# Patient Record
Sex: Female | Born: 2000 | Race: White | Hispanic: No | Marital: Single | Smoking: Current every day smoker
Health system: Southern US, Community
[De-identification: ages and names within clinical notes are randomized; demographics above are authoritative.]

## PROBLEM LIST (undated history)

## (undated) DIAGNOSIS — Z87448 Personal history of other diseases of urinary system: Secondary | ICD-10-CM

---
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
## 2021-02-09 ENCOUNTER — Encounter: Payer: Self-pay | Admitting: Emergency Medicine

## 2021-02-09 ENCOUNTER — Ambulatory Visit
Admission: EM | Admit: 2021-02-09 | Discharge: 2021-02-09 | Disposition: A | Discharge status: Home / Self Care | Payer: Medicaid Other | Attending: Emergency Medicine | Admitting: Emergency Medicine

## 2021-02-09 ENCOUNTER — Other Ambulatory Visit: Payer: Self-pay

## 2021-02-09 DIAGNOSIS — R350 Frequency of micturition: Secondary | ICD-10-CM

## 2021-02-09 LAB — POCT URINALYSIS DIP (MANUAL ENTRY)
Bilirubin, UA: NEGATIVE
Blood, UA: NEGATIVE
Glucose, UA: NEGATIVE mg/dL
Ketones, POC UA: NEGATIVE mg/dL
Nitrite, UA: NEGATIVE
Protein Ur, POC: NEGATIVE mg/dL
Spec Grav, UA: 1.025 (ref 1.010–1.025)
Urobilinogen, UA: 0.2 E.U./dL
pH, UA: 7 (ref 5.0–8.0)

## 2021-02-09 LAB — POCT URINE PREGNANCY: Preg Test, Ur: NEGATIVE

## 2021-02-09 MED ORDER — SULFAMETHOXAZOLE-TRIMETHOPRIM 800-160 MG PO TABS: 1.0000 | ORAL_TABLET | Freq: Two times a day (BID) | ORAL | 0 refills | Status: AC

## 2021-02-09 NOTE — ED Triage Notes (Signed)
Patient c/o increased urinary frequency x 1 month.   Patient endorses dribbling. Patient endorses intermittent ABD pain.   Patient endorses frequent episodes of UTI over the past 6 months. Patient's last episode was 3-4 months ago.   Patient denies dysuria, foul odor, hematuria, and abnormal vaginal discharge.   Patient hasn't used any medications for symptoms.

## 2021-02-09 NOTE — Discharge Instructions (Signed)
Urine culture pending, vaginal swab pending Begin Bactrim twice daily for 1 week Follow-up with primary care/urology for further evaluation of persistent urinary symptoms

## 2021-02-09 NOTE — ED Provider Notes (Signed)
[facility]    CSN: [id number] Arrival date & time: 02/09/21  1707      History   Chief Complaint Chief Complaint  Patient presents with  . Urinary Frequency    HPI Ma[facility] is a 20 y.o. female presenting today for evaluation of possible UTI.  Reports urinary frequency x1 month.  Reports continued pressure in urination after emptying bladder.  Denies dysuria.  Reports history of prior UTIs.  History of vesicoureteral reflux when younger and saw urology, has not seen urology recently.  She denies any fever, nausea vomiting or abdominal pain.  Denies any vaginal symptoms of discharge itching or irritation.  HPI  Past Medical History:  Diagnosis Date  . History of urinary reflux     There are no problems to display for this patient.   History reviewed. No pertinent surgical history.  OB History   No obstetric history on file.      Home Medications    Prior to Admission medications   Medication Sig Start Date End Date Taking? Authorizing Provider  sulfamethoxazole-trimethoprim (BACTRIM DS) 800-160 MG tablet Take 1 tablet by mouth 2 (two) times daily for 7 days. 02/09/21 02/16/21 Yes Caylan Schifano C, PA-C  etonogestrel (NEXPLANON) 68 MG IMPL implant 68 mg.    [provider]  lansoprazole (PREVACID) 15 MG capsule Take 15 mg by mouth daily at 12 noon.  02/09/21  [provider]  norelgestromin-ethinyl estradiol (ORTHO EVRA) 150-35 MCG/24HR transdermal patch Place onto the skin. 11/23/15 02/09/21  [provider]    Family History History reviewed. No pertinent family history.  Social History Social History   Tobacco Use  . Smoking status: Never Smoker  . Smokeless tobacco: Never Used  Vaping Use  . Vaping Use: Every day  Substance Use Topics  . Alcohol use: Never  . Drug use: Never     Allergies   Rocephin [ceftriaxone]   Review of Systems Review of Systems  Constitutional: Negative for fever.  Respiratory:  Negative for shortness of breath.   Cardiovascular: Negative for chest pain.  Gastrointestinal: Negative for abdominal pain, diarrhea, nausea and vomiting.  Genitourinary: Positive for frequency. Negative for dysuria, flank pain, genital sores, hematuria, menstrual problem, vaginal bleeding, vaginal discharge and vaginal pain.  Musculoskeletal: Negative for back pain.  Skin: Negative for rash.  Neurological: Negative for dizziness, light-headedness and headaches.     Physical Exam Triage Vital Signs ED Triage Vitals  Enc Vitals Group     BP      Pulse      Resp      Temp      Temp src      SpO2      Weight      Height      Head Circumference      Peak Flow      Pain Score      Pain Loc      Pain Edu?      Excl. in GC?    No data found.  Updated Vital Signs BP 106/69 (BP Location: Left Arm)   Pulse 91   Temp 99.2 F (37.3 C) (Oral)   Resp 15   Ht 5' 2.5" (1.588 m)   LMP 01/26/2021   SpO2 98%   Visual Acuity Right Eye Distance:   Left Eye Distance:   Bilateral Distance:    Right Eye Near:   Left Eye Near:    Bilateral Near:     Physical Exam  Vitals and nursing note reviewed.  Constitutional:      Appearance: She is well-developed.     Comments: No acute distress  HENT:     Head: Normocephalic and atraumatic.     Nose: Nose normal.  Eyes:     Conjunctiva/sclera: Conjunctivae normal.  Cardiovascular:     Rate and Rhythm: Normal rate.  Pulmonary:     Effort: Pulmonary effort is normal. No respiratory distress.  Abdominal:     General: There is no distension.  Musculoskeletal:        General: Normal range of motion.     Cervical back: Neck supple.  Skin:    General: Skin is warm and dry.  Neurological:     Mental Status: She is alert and oriented to person, place, and time.      UC Treatments / Results  Labs (all labs ordered are listed, but only abnormal results are displayed) Labs Reviewed  POCT URINALYSIS DIP (MANUAL ENTRY) - Abnormal;  Notable for the following components:      Result Value   Clarity, UA cloudy (*)    Leukocytes, UA Trace (*)    All other components within normal limits  URINE CULTURE  POCT URINE PREGNANCY  CERVICOVAGINAL ANCILLARY ONLY    EKG   Radiology No results found.  Procedures Procedures (including critical care time)  Medications Ordered in UC Medications - No data to display  Initial Impression / Assessment and Plan / UC Course  I have reviewed the triage vital signs and the nursing notes.  Pertinent labs & imaging results that were available during my care of the patient were reviewed by me and considered in my medical decision making (see chart for details).     UA with only trace leuks, will send for urine culture as well as checking vaginal swab.  Given patient's history did opt to still proceed with treatment for UTI with Bactrim, has prior allergy to Rocephin, will avoid Keflex for now.  Encouraged follow-up with PCP/urology if symptoms persisting and urine culture and vaginal swab negative/not improving with clinical treatment for UTI.  Discussed strict return precautions. Patient verbalized understanding and is agreeable with plan.  Final Clinical Impressions(s) / UC Diagnoses   Final diagnoses:  Urinary frequency     Discharge Instructions     Urine culture pending, vaginal swab pending Begin Bactrim twice daily for 1 week Follow-up with primary care/urology for further evaluation of persistent urinary symptoms    ED Prescriptions    Medication Sig Dispense Auth. Provider   sulfamethoxazole-trimethoprim (BACTRIM DS) 800-160 MG tablet Take 1 tablet by mouth 2 (two) times daily for 7 days. 14 tablet Teffany Blaszczyk, [place] C, PA-C     PDMP not reviewed this encounter.   Lew Dawes, [place] 02/09/21 1857

## 2021-02-11 LAB — URINE CULTURE: Culture: <10000 — AB

## 2021-02-12 LAB — CERVICOVAGINAL ANCILLARY ONLY
Bacterial Vaginitis (gardnerella): POSITIVE — AB
Candida Glabrata: NEGATIVE
Candida Vaginitis: NEGATIVE
Chlamydia: NEGATIVE
Comment: NEGATIVE
Comment: NEGATIVE
Comment: NEGATIVE
Comment: NEGATIVE
Comment: NEGATIVE
Comment: NORMAL
Neisseria Gonorrhea: NEGATIVE
Trichomonas: NEGATIVE

## 2021-02-13 ENCOUNTER — Telehealth (HOSPITAL_COMMUNITY): Payer: Self-pay | Admitting: Emergency Medicine

## 2021-02-13 MED ORDER — METRONIDAZOLE 500 MG PO TABS: 500.0000 mg | ORAL_TABLET | Freq: Two times a day (BID) | ORAL | 0 refills | Status: DC

## 2021-07-05 ENCOUNTER — Encounter (HOSPITAL_BASED_OUTPATIENT_CLINIC_OR_DEPARTMENT_OTHER): Payer: Self-pay | Admitting: Emergency Medicine

## 2021-07-05 ENCOUNTER — Other Ambulatory Visit: Payer: Self-pay

## 2021-07-05 ENCOUNTER — Emergency Department (HOSPITAL_BASED_OUTPATIENT_CLINIC_OR_DEPARTMENT_OTHER)
Admission: EM | Admit: 2021-07-05 | Discharge: 2021-07-05 | Disposition: A | Discharge status: Home / Self Care | Payer: Medicaid Other | Attending: Emergency Medicine | Admitting: Emergency Medicine

## 2021-07-05 DIAGNOSIS — B349 Viral infection, unspecified: Secondary | ICD-10-CM | POA: Diagnosis not present

## 2021-07-05 DIAGNOSIS — R590 Localized enlarged lymph nodes: Secondary | ICD-10-CM | POA: Insufficient documentation

## 2021-07-05 DIAGNOSIS — R519 Headache, unspecified: Secondary | ICD-10-CM | POA: Diagnosis present

## 2021-07-05 NOTE — ED Provider Notes (Signed)
[facility] EMERGENCY DEPARTMENT Provider Note   CSN: [id number] Arrival date & time: 07/05/21  0830     History Chief Complaint  Patient presents with   Sore Throat    Maureen Bailey is a 20 y.o. female presented emerged department with headache for 3 days and a sore throat for 2 days.  Patient reports that the headache was mild and frontal, she does not typically have headaches.  That is improved but she began having a sore throat.  She describes as discomfort in her lower neck when she swallows specifically, no pain with speaking.  No pain in the back of the mouth.  She was reported distant history of strep throat when she was younger.  She has had no fevers or chills, no nausea or vomiting.  She lives in a house with 2 children who are both actively sick with congestion, cough.  HPI     Past Medical History:  Diagnosis Date   History of urinary reflux     There are no problems to display for this patient.   History reviewed. No pertinent surgical history.   OB History   No obstetric history on file.     No family history on file.  Social History   Tobacco Use   Smoking status: Never   Smokeless tobacco: Never  Vaping Use   Vaping Use: Every day  Substance Use Topics   Alcohol use: Never   Drug use: Never    Home Medications Prior to Admission medications   Medication Sig Start Date End Date Taking? Authorizing Provider  etonogestrel (NEXPLANON) 68 MG IMPL implant 68 mg.    [provider]  metroNIDAZOLE (FLAGYL) 500 MG tablet Take 1 tablet (500 mg total) by mouth 2 (two) times daily. 02/13/21   Merrilee Jansky, MD  lansoprazole (PREVACID) 15 MG capsule Take 15 mg by mouth daily at 12 noon.  02/09/21  [provider]  norelgestromin-ethinyl estradiol (ORTHO EVRA) 150-35 MCG/24HR transdermal patch Place onto the skin. 11/23/15 02/09/21  [provider]    Allergies    Ceftriaxone  Review of Systems   Review of Systems   Constitutional:  Negative for chills and fever.  HENT:  Positive for sore throat. Negative for ear pain, rhinorrhea, trouble swallowing and voice change.   Eyes:  Negative for pain and visual disturbance.  Respiratory:  Negative for cough and shortness of breath.   Cardiovascular:  Negative for chest pain and palpitations.  Gastrointestinal:  Negative for abdominal pain and vomiting.  Genitourinary:  Negative for dysuria and hematuria.  Musculoskeletal:  Negative for arthralgias and back pain.  Skin:  Negative for color change and rash.  Neurological:  Positive for headaches. Negative for syncope and light-headedness.  All other systems reviewed and are negative.  Physical Exam Updated Vital Signs BP 114/63 (BP Location: Left Arm)   Pulse (!) 59   Temp 98.5 F (36.9 C) (Oral)   Resp 20   Ht 5\' 2"  (1.575 m)   Wt 43 kg   LMP 06/01/2021 Comment: period lasted one month  SpO2 100%   BMI 17.32 kg/m   Physical Exam Constitutional:      General: She is not in acute distress. HENT:     Head: Normocephalic and atraumatic.     Mouth/Throat:     Mouth: Mucous membranes are moist. No oral lesions.     Pharynx: No pharyngeal swelling, oropharyngeal exudate, posterior oropharyngeal erythema or uvula swelling.  Comments: +Cervical lympnadenopathy Eyes:     Conjunctiva/sclera: Conjunctivae normal.     Pupils: Pupils are equal, round, and reactive to light.  Cardiovascular:     Rate and Rhythm: Normal rate and regular rhythm.  Pulmonary:     Effort: Pulmonary effort is normal. No respiratory distress.  Abdominal:     General: There is no distension.     Tenderness: There is no abdominal tenderness.  Skin:    General: Skin is warm and dry.  Neurological:     General: No focal deficit present.     Mental Status: She is alert. Mental status is at baseline.  Psychiatric:        Mood and Affect: Mood normal.        Behavior: Behavior normal.    ED Results / Procedures /  Treatments   Labs (all labs ordered are listed, but only abnormal results are displayed) Labs Reviewed - No data to display  EKG None  Radiology No results found.  Procedures Procedures   Medications Ordered in ED Medications - No data to display  ED Course  I have reviewed the triage vital signs and the nursing notes.  Pertinent labs & imaging results that were available during my care of the patient were reviewed by me and considered in my medical decision making (see chart for details).  I strongly suspect this is a viral syndrome, given that she has had a headache, has a cervical lymphadenopathy, and has multiple sick contacts in the same house.  I do not see any evidence otherwise of strep pharyngitis on her exam, no tonsillar exudates or swelling.  She does not want a COVID test.  Her vital signs are otherwise stable.  I doubt sepsis or bacterial infection at this time.  We discussed supportive care at home for the next several days, advise follow-up with a PCP or urgent care if she continues having sore throat beyond that.  She verbalized understanding.  Final Clinical Impression(s) / ED Diagnoses Final diagnoses:  Cervical lymphadenopathy  Viral syndrome    Rx / DC Orders ED Discharge Orders     None        Terald Sleeper, MD 07/05/21 1156

## 2021-07-05 NOTE — Discharge Instructions (Addendum)
I suspect you may have a viral syndrome.  I recommend he continue taking ibuprofen and drink plenty of water for the next several days.  Most viruses resolve within a week.  You have some tenderness of your lymph nodes which is a normal reaction to a virus.  This may be causing your sore throat.  If your symptoms get worse, I recommend visiting an urgent care or primary care physician for your sore throat.  If you begin having chest pain, lightheadedness, dizziness, loss of consciousness, you should come back to the emergency room.

## 2021-07-05 NOTE — ED Triage Notes (Signed)
Sore throat x 2 days.  No fever.

## 2021-09-04 ENCOUNTER — Other Ambulatory Visit: Payer: Self-pay

## 2021-09-04 ENCOUNTER — Ambulatory Visit
Admission: EM | Admit: 2021-09-04 | Discharge: 2021-09-04 | Disposition: A | Discharge status: Home / Self Care | Payer: Medicaid Other | Attending: Physician Assistant | Admitting: Physician Assistant

## 2021-09-04 DIAGNOSIS — J101 Influenza due to other identified influenza virus with other respiratory manifestations: Secondary | ICD-10-CM | POA: Diagnosis not present

## 2021-09-04 LAB — POCT INFLUENZA A/B
Influenza A, POC: POSITIVE — AB
Influenza B, POC: NEGATIVE

## 2021-09-04 MED ORDER — OSELTAMIVIR PHOSPHATE 75 MG PO CAPS: 75.0000 mg | ORAL_CAPSULE | Freq: Two times a day (BID) | ORAL | 0 refills | Status: DC

## 2021-09-04 NOTE — ED Provider Notes (Signed)
[facility]    CSN: [id number] Arrival date & time: 09/04/21  0914      History   Chief Complaint Chief Complaint  Patient presents with   Cough    HPI Maureen Bailey is a 20 y.o. female.   Patient here today for evaluation of cough congestion she has had for the last week.  She is also had some fever.  She denies any vomiting or diarrhea.  She has tried over-the-counter medication without significant relief.  The history is provided by the patient.  Cough Associated symptoms: fever and sore throat   Associated symptoms: no ear pain, no eye discharge, no shortness of breath and no wheezing    Past Medical History:  Diagnosis Date   History of urinary reflux     There are no problems to display for this patient.   History reviewed. No pertinent surgical history.  OB History   No obstetric history on file.      Home Medications    Prior to Admission medications   Medication Sig Start Date End Date Taking? Authorizing Provider  oseltamivir (TAMIFLU) 75 MG capsule Take 1 capsule (75 mg total) by mouth every 12 (twelve) hours. 09/04/21  Yes Tomi Bamberger, PA-C  etonogestrel (NEXPLANON) 68 MG IMPL implant 68 mg.    [provider]  lansoprazole (PREVACID) 15 MG capsule Take 15 mg by mouth daily at 12 noon.  02/09/21  [provider]  norelgestromin-ethinyl estradiol (ORTHO EVRA) 150-35 MCG/24HR transdermal patch Place onto the skin. 11/23/15 02/09/21  [provider]    Family History History reviewed. No pertinent family history.  Social History Social History   Tobacco Use   Smoking status: Never   Smokeless tobacco: Never  Vaping Use   Vaping Use: Every day  Substance Use Topics   Alcohol use: Never   Drug use: Never     Allergies   Ceftriaxone   Review of Systems Review of Systems  Constitutional:  Positive for fatigue and fever.  HENT:  Positive for congestion, sinus pressure and sore throat. Negative  for ear pain.   Eyes:  Negative for discharge and redness.  Respiratory:  Positive for cough. Negative for shortness of breath and wheezing.   Gastrointestinal:  Negative for abdominal pain, diarrhea, nausea and vomiting.    Physical Exam Triage Vital Signs ED Triage Vitals  Enc Vitals Group     BP 09/04/21 1035 120/80     Pulse Rate 09/04/21 1035 (!) 104     Resp 09/04/21 1035 18     Temp 09/04/21 1035 99.5 F (37.5 C)     Temp Source 09/04/21 1035 Oral     SpO2 09/04/21 1035 99 %     Weight --      Height --      Head Circumference --      Peak Flow --      Pain Score 09/04/21 1036 8     Pain Loc --      Pain Edu? --      Excl. in [place]? --    No data found.  Updated Vital Signs BP 120/80 (BP Location: Left Arm)   Pulse (!) 104   Temp 99.5 F (37.5 C) (Oral)   Resp 18   LMP 09/03/2021   SpO2 99%      Physical Exam Vitals and nursing note reviewed.  Constitutional:      General: She is not in acute distress.  Appearance: Normal appearance. She is not ill-appearing.  HENT:     Head: Normocephalic and atraumatic.     Right Ear: Tympanic membrane normal.     Left Ear: Tympanic membrane normal.     Nose: Congestion present.     Mouth/Throat:     Mouth: Mucous membranes are moist.     Pharynx: No oropharyngeal exudate or posterior oropharyngeal erythema.  Eyes:     Conjunctiva/sclera: Conjunctivae normal.  Cardiovascular:     Rate and Rhythm: Normal rate and regular rhythm.     Heart sounds: Normal heart sounds. No murmur heard. Pulmonary:     Effort: Pulmonary effort is normal. No respiratory distress.     Breath sounds: Normal breath sounds. No wheezing, rhonchi or rales.  Skin:    General: Skin is warm and dry.  Neurological:     Mental Status: She is alert.  Psychiatric:        Mood and Affect: Mood normal.        Thought Content: Thought content normal.     UC Treatments / Results  Labs (all labs ordered are listed, but only abnormal results are  displayed) Labs Reviewed  POCT INFLUENZA A/B - Abnormal; Notable for the following components:      Result Value   Influenza A, POC Positive (*)    All other components within normal limits    EKG   Radiology No results found.  Procedures Procedures (including critical care time)  Medications Ordered in UC Medications - No data to display  Initial Impression / Assessment and Plan / UC Course  I have reviewed the triage vital signs and the nursing notes.  Pertinent labs & imaging results that were available during my care of the patient were reviewed by me and considered in my medical decision making (see chart for details).  Flu test positive in office.  Will treat with Tamiflu given continued symptoms.  Recommended symptomatic treatment otherwise.  Encouraged follow-up if symptoms fail to improve or worsen.  Final Clinical Impressions(s) / UC Diagnoses   Final diagnoses:  Influenza A   Discharge Instructions   None    ED Prescriptions     Medication Sig Dispense Auth. Provider   oseltamivir (TAMIFLU) 75 MG capsule Take 1 capsule (75 mg total) by mouth every 12 (twelve) hours. 10 capsule Tomi Bamberger, PA-C      PDMP not reviewed this encounter.   Tomi Bamberger, PA-C 09/04/21 1124

## 2021-09-04 NOTE — ED Triage Notes (Signed)
Pt c/o cough, congestion, fatigue, fever, and bilateral ear pain x1wk. Taking OTC meds with mild relief.

## 2022-07-09 ENCOUNTER — Emergency Department (HOSPITAL_BASED_OUTPATIENT_CLINIC_OR_DEPARTMENT_OTHER)
Admission: EM | Admit: 2022-07-09 | Discharge: 2022-07-09 | Discharge status: Against Medical Advice | Payer: Medicaid Other | Attending: Emergency Medicine | Admitting: Emergency Medicine

## 2022-07-09 ENCOUNTER — Other Ambulatory Visit: Payer: Self-pay

## 2022-07-09 ENCOUNTER — Encounter: Payer: Self-pay | Admitting: Physician Assistant

## 2022-07-09 ENCOUNTER — Ambulatory Visit
Admission: EM | Admit: 2022-07-09 | Discharge: 2022-07-09 | Disposition: A | Discharge status: Home / Self Care | Payer: Medicaid Other | Attending: Physician Assistant | Admitting: Physician Assistant

## 2022-07-09 DIAGNOSIS — R809 Proteinuria, unspecified: Secondary | ICD-10-CM

## 2022-07-09 DIAGNOSIS — R824 Acetonuria: Secondary | ICD-10-CM | POA: Diagnosis not present

## 2022-07-09 DIAGNOSIS — R109 Unspecified abdominal pain: Secondary | ICD-10-CM | POA: Diagnosis not present

## 2022-07-09 DIAGNOSIS — Z5321 Procedure and treatment not carried out due to patient leaving prior to being seen by health care provider: Secondary | ICD-10-CM | POA: Diagnosis not present

## 2022-07-09 DIAGNOSIS — Z20822 Contact with and (suspected) exposure to covid-19: Secondary | ICD-10-CM | POA: Insufficient documentation

## 2022-07-09 DIAGNOSIS — R1110 Vomiting, unspecified: Secondary | ICD-10-CM | POA: Insufficient documentation

## 2022-07-09 DIAGNOSIS — R112 Nausea with vomiting, unspecified: Secondary | ICD-10-CM | POA: Diagnosis not present

## 2022-07-09 LAB — CBC WITH DIFFERENTIAL/PLATELET
Abs Immature Granulocytes: 0.09 10*3/uL — ABNORMAL HIGH (ref 0.00–0.07)
Basophils Absolute: 0.1 10*3/uL (ref 0.0–0.1)
Basophils Relative: 0 %
Eosinophils Absolute: 0 10*3/uL (ref 0.0–0.5)
Eosinophils Relative: 0 %
HCT: 42.9 % (ref 36.0–46.0)
Hemoglobin: 14.1 g/dL (ref 12.0–15.0)
Immature Granulocytes: 1 %
Lymphocytes Relative: 4 %
Lymphs Abs: 0.7 10*3/uL (ref 0.7–4.0)
MCH: 27.7 pg (ref 26.0–34.0)
MCHC: 32.9 g/dL (ref 30.0–36.0)
MCV: 84.3 fL (ref 80.0–100.0)
Monocytes Absolute: 0.5 10*3/uL (ref 0.1–1.0)
Monocytes Relative: 3 %
Neutro Abs: 16.8 10*3/uL — ABNORMAL HIGH (ref 1.7–7.7)
Neutrophils Relative %: 92 %
Platelets: 189 10*3/uL (ref 150–400)
RBC: 5.09 MIL/uL (ref 3.87–5.11)
RDW: 12.5 % (ref 11.5–15.5)
Smear Review: NORMAL
WBC: 18 10*3/uL — ABNORMAL HIGH (ref 4.0–10.5)
nRBC: 0 % (ref 0.0–0.2)

## 2022-07-09 LAB — COMPREHENSIVE METABOLIC PANEL
ALT: 15 U/L (ref 0–44)
AST: 20 U/L (ref 15–41)
Albumin: 4.6 g/dL (ref 3.5–5.0)
Alkaline Phosphatase: 55 U/L (ref 38–126)
Anion gap: 11 (ref 5–15)
BUN: 14 mg/dL (ref 6–20)
CO2: 20 mmol/L — ABNORMAL LOW (ref 22–32)
Calcium: 9.5 mg/dL (ref 8.9–10.3)
Chloride: 107 mmol/L (ref 98–111)
Creatinine, Ser: 0.67 mg/dL (ref 0.44–1.00)
GFR, Estimated: >60 mL/min (ref >60)
Glucose, Bld: 118 mg/dL — ABNORMAL HIGH (ref 70–99)
Potassium: 3.5 mmol/L (ref 3.5–5.1)
Sodium: 138 mmol/L (ref 135–145)
Total Bilirubin: 1 mg/dL (ref 0.3–1.2)
Total Protein: 7.9 g/dL (ref 6.5–8.1)

## 2022-07-09 LAB — POCT URINALYSIS DIP (MANUAL ENTRY)
Glucose, UA: NEGATIVE mg/dL
Leukocytes, UA: NEGATIVE
Nitrite, UA: NEGATIVE
Protein Ur, POC: 100 mg/dL — AB
Spec Grav, UA: 1.025 (ref 1.010–1.025)
Urobilinogen, UA: 0.2 E.U./dL
pH, UA: 7 (ref 5.0–8.0)

## 2022-07-09 LAB — LIPASE, BLOOD: Lipase: 30 U/L (ref 11–51)

## 2022-07-09 LAB — SARS CORONAVIRUS 2 BY RT PCR: SARS Coronavirus 2 by RT PCR: NEGATIVE

## 2022-07-09 LAB — POCT URINE PREGNANCY: Preg Test, Ur: NEGATIVE

## 2022-07-09 LAB — PREGNANCY, URINE: Preg Test, Ur: NEGATIVE

## 2022-07-09 NOTE — ED Triage Notes (Addendum)
Pt c/o abd pain and vomiting since this morning; sent here from UC for further w/u; UA results in chart; had zofran odt at 1330

## 2022-07-09 NOTE — ED Notes (Signed)
Patient is being discharged from the Urgent Care and sent to the Emergency Department via pov with mother . Per myers pa, patient is in need of higher level of care due to symptoms and dehydration. Patient is aware and verbalizes understanding of plan of care.  Vitals:   07/09/22 1555  BP: 100/64  Pulse: 78  Resp: 18  Temp: 97.6 F (36.4 C)  SpO2: 99%

## 2022-07-09 NOTE — ED Provider Notes (Signed)
[facility]    CSN: [id number] Arrival date & time: 07/09/22  1453      History   Chief Complaint Chief Complaint  Patient presents with   Abdominal Pain    HPI Maureen Bailey is a 21 y.o. female.   Patient here today for evaluation of nausea and vomiting that started today. She has had diarrhea for 2 days. She tried taking zofran at home with no improvement. She has also tried using bentyl without improvement of pain in her abdomen. She reports abdominal pain is a stabbing pain that is constant.   The history is provided by the patient.  Abdominal Pain Associated symptoms: diarrhea, fatigue, nausea and vomiting   Associated symptoms: no chills and no fever     Past Medical History:  Diagnosis Date   History of urinary reflux     There are no problems to display for this patient.   History reviewed. No pertinent surgical history.  OB History   No obstetric history on file.      Home Medications    Prior to Admission medications   Medication Sig Start Date End Date Taking? Authorizing Provider  etonogestrel (NEXPLANON) 68 MG IMPL implant 68 mg.    [provider]  oseltamivir (TAMIFLU) 75 MG capsule Take 1 capsule (75 mg total) by mouth every 12 (twelve) hours. 09/04/21   Francene Finders, PA-C  lansoprazole (PREVACID) 15 MG capsule Take 15 mg by mouth daily at 12 noon.  02/09/21  [provider]  norelgestromin-ethinyl estradiol (ORTHO EVRA) 150-35 MCG/24HR transdermal patch Place onto the skin. 11/23/15 02/09/21  [provider]    Family History History reviewed. No pertinent family history.  Social History Social History   Tobacco Use   Smoking status: Never   Smokeless tobacco: Never  Vaping Use   Vaping Use: Every day  Substance Use Topics   Alcohol use: Never   Drug use: Never     Allergies   Ceftriaxone   Review of Systems Review of Systems  Constitutional:  Positive for diaphoresis and fatigue.  Negative for chills and fever.  Eyes:  Negative for discharge and redness.  Gastrointestinal:  Positive for abdominal pain, diarrhea, nausea and vomiting.     Physical Exam Triage Vital Signs ED Triage Vitals  Enc Vitals Group     BP 07/09/22 1555 100/64     Pulse Rate 07/09/22 1555 78     Resp 07/09/22 1555 18     Temp 07/09/22 1555 97.6 F (36.4 C)     Temp Source 07/09/22 1555 Oral     SpO2 07/09/22 1555 99 %     Weight --      Height --      Head Circumference --      Peak Flow --      Pain Score 07/09/22 1554 4     Pain Loc --      Pain Edu? --      Excl. in [place]? --    No data found.  Updated Vital Signs BP 100/64   Pulse 78   Temp 97.6 F (36.4 C) (Oral)   Resp 18   LMP 07/01/2022   SpO2 99%       Physical Exam Vitals and nursing note reviewed.  Constitutional:      General: She is not in acute distress.    Appearance: Normal appearance. She is ill-appearing (appears to not feel well- sleeping curled up on exam table).  HENT:     Head: Normocephalic and atraumatic.  Eyes:     Conjunctiva/sclera: Conjunctivae normal.  Cardiovascular:     Rate and Rhythm: Normal rate.  Pulmonary:     Effort: Pulmonary effort is normal.  Neurological:     Mental Status: She is alert.  Psychiatric:        Mood and Affect: Mood normal.        Behavior: Behavior normal.        Thought Content: Thought content normal.      UC Treatments / Results  Labs (all labs ordered are listed, but only abnormal results are displayed) Labs Reviewed  POCT URINALYSIS DIP (MANUAL ENTRY) - Abnormal; Notable for the following components:      Result Value   Clarity, UA hazy (*)    Bilirubin, UA small (*)    Ketones, POC UA >= (160) (*)    Blood, UA trace-intact (*)    Protein Ur, POC =100 (*)    All other components within normal limits  POCT URINE PREGNANCY    EKG   Radiology No results found.  Procedures Procedures (including critical care time)  Medications Ordered  in UC Medications - No data to display  Initial Impression / Assessment and Plan / UC Course  I have reviewed the triage vital signs and the nursing notes.  Pertinent labs & imaging results that were available during my care of the patient were reviewed by me and considered in my medical decision making (see chart for details).    Given proteinuria recommended further evaluation in the ED for stat labs and fluid resuscitation/ imaging if needed. Patient is agreeable and grandmother who is here with her today will drive her via POV.   Final Clinical Impressions(s) / UC Diagnoses   Final diagnoses:  Nausea and vomiting, unspecified vomiting type  Proteinuria, unspecified type  Ketonuria   Discharge Instructions   None    ED Prescriptions   None    PDMP not reviewed this encounter.   Francene Finders, PA-C 07/09/22 1731

## 2022-07-09 NOTE — ED Triage Notes (Signed)
Pt presents to uc with co of abd pain and nausea, vomiting since this morning. Pt reports diarrhea for 2 days. Pt attempted Zofran at home with no improvement. Mother gave patient bentyl as well which has not helped with the pain. Pt st pain is 4/10 that is constant stabbing pain that is starting in lower abdomin and shooting up sides of abdomin

## 2023-05-07 ENCOUNTER — Ambulatory Visit
Admission: EM | Admit: 2023-05-07 | Discharge: 2023-05-07 | Disposition: A | Discharge status: Home / Self Care | Payer: Medicaid Other

## 2023-05-07 ENCOUNTER — Telehealth: Payer: Self-pay | Admitting: *Deleted

## 2023-05-07 DIAGNOSIS — B354 Tinea corporis: Secondary | ICD-10-CM | POA: Diagnosis not present

## 2023-05-07 MED ORDER — KETOCONAZOLE 2 % EX CREA: 1.0000 | TOPICAL_CREAM | Freq: Every day | CUTANEOUS | 0 refills | Status: DC

## 2023-05-07 NOTE — ED Triage Notes (Signed)
"  I have a spot that looks like ringworm on my left shoulder, I have used antifungal cream that hasn't helped". "I may have a few in other places too". No fever.

## 2023-05-07 NOTE — ED Provider Notes (Signed)
[facility]    CSN: [id number] Arrival date & time: 05/07/23  1452      History   Chief Complaint Chief Complaint  Patient presents with   Skin Problem    HPI Maureen Bailey is a 21 y.o. female.   Patient here today for possible ringworm to her left shoulder.  She states that she has had area for several days that seems to not be improving with antifungal cream she has bought over-the-counter.  She notes area is somewhat itchy.  She has some similar spots appearing that look like this area did initially.  She has not had any fever or shortness of breath.  The history is provided by the patient.    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 No pertinent surgical history.  OB History   No obstetric history on file.      Home Medications    Prior to Admission medications   Medication Sig Start Date End Date Taking? Authorizing Provider  acetaminophen (TYLENOL) 500 MG tablet Take 500 mg by mouth every 6 (six) hours as needed for mild pain, fever, headache or moderate pain. 01/05/20  Yes [provider]  etonogestrel (NEXPLANON) 68 MG IMPL implant 68 mg.   Yes [provider]  ketoconazole (NIZORAL) 2 % cream Apply 1 Application topically daily. 05/07/23  Yes Tomi Bamberger, PA-C  lansoprazole (PREVACID) 15 MG capsule Take 15 mg by mouth as directed. 01/05/20  Yes [provider]  lidocaine (LIDODERM) 5 % Place 1 patch onto the skin daily. 10/25/22  Yes [provider]  norelgestromin-ethinyl estradiol [employer]) 150-35 MCG/24HR transdermal patch Place 1 patch onto the skin once a week. 11/23/15  Yes [provider]  ondansetron (ZOFRAN-ODT) 4 MG disintegrating  tablet Take 4 mg by mouth every 8 (eight) hours as needed for nausea or vomiting. 07/10/22  Yes [provider]  oseltamivir (TAMIFLU) 75 MG capsule Take 1 capsule (75 mg total) by mouth every 12 (twelve) hours. 09/04/21   Tomi Bamberger, PA-C    Family History History reviewed. No pertinent family history.  Social History Social History   Tobacco Use   Smoking status: Every Day    Current packs/day: 0.50    Types: Cigarettes   Smokeless tobacco: Never  Vaping Use   Vaping status: Former  Substance Use Topics   Alcohol use: Never   Drug use: Never     Allergies   Ceftriaxone   Review of Systems Review of Systems  Constitutional:  Negative for chills and fever.  Eyes:  Negative for discharge and redness.  Respiratory:  Negative for shortness of breath.   Skin:  Positive for rash.     Physical Exam Triage Vital Signs ED Triage Vitals  Encounter Vitals Group     BP 05/07/23 1500 116/75     Systolic BP Percentile --      Diastolic BP Percentile --      Pulse Rate 05/07/23 1500 63     Resp 05/07/23 1500 16     Temp 05/07/23 1500 98.3 F (36.8 C)     Temp Source 05/07/23 1500 Oral     SpO2 05/07/23 1500 99 %     Weight 05/07/23 1458  94 lb 12.8 oz (43 kg)     Height 05/07/23 1458 5\' 2"  (1.575 m)     Head Circumference --      Peak Flow --      Pain Score 05/07/23 1458 0     Pain Loc --      Pain Education --      Exclude from Growth Chart --    No data found.  Updated Vital Signs BP 116/75 (BP Location: Right Arm)   Pulse 63   Temp 98.3 F (36.8 C) (Oral)   Resp 16   Ht 5\' 2"  (1.575 m)   Wt 94 lb 12.8 oz (43 kg)   LMP  (LMP Unknown)   SpO2 99%   BMI 17.34 kg/m   Visual Acuity Right Eye Distance:   Left Eye Distance:   Bilateral Distance:    Right Eye Near:   Left Eye Near:    Bilateral Near:     Physical Exam Vitals and nursing note reviewed.  Constitutional:      General: She is not in acute distress.    Appearance: Normal  appearance. She is not ill-appearing.  HENT:     Head: Normocephalic and atraumatic.  Eyes:     Conjunctiva/sclera: Conjunctivae normal.  Cardiovascular:     Rate and Rhythm: Normal rate.  Pulmonary:     Effort: Pulmonary effort is normal. No respiratory distress.  Skin:    Comments: Approx 1.5 cm area of mild erythema with raised, dry, scaling, well demarcated borders   Neurological:     Mental Status: She is alert.  Psychiatric:        Mood and Affect: Mood normal.        Behavior: Behavior normal.        Thought Content: Thought content normal.      UC Treatments / Results  Labs (all labs ordered are listed, but only abnormal results are displayed) Labs Reviewed - No data to display  EKG   Radiology No results found.  Procedures Procedures (including critical care time)  Medications Ordered in UC Medications - No data to display  Initial Impression / Assessment and Plan / UC Course  I have reviewed the triage vital signs and the nursing notes.  Pertinent labs & imaging results that were available during my care of the patient were reviewed by me and considered in my medical decision making (see chart for details).    Lesion appears consistent with tinea corporis. Will treat with ketoconazole and encouraged follow up if no gradual improvement or with any further concerns.   Final Clinical Impressions(s) / UC Diagnoses   Final diagnoses:  Tinea corporis   Discharge Instructions   None    ED Prescriptions     Medication Sig Dispense Auth. Provider   ketoconazole (NIZORAL) 2 % cream Apply 1 Application topically daily. 15 g Tomi Bamberger, PA-C      PDMP not reviewed this encounter.   Tomi Bamberger, PA-C 05/07/23 1520

## 2024-04-07 ENCOUNTER — Ambulatory Visit
Admission: EM | Admit: 2024-04-07 | Discharge: 2024-04-07 | Disposition: A | Discharge status: Home / Self Care | Attending: Family Medicine | Admitting: Family Medicine

## 2024-04-07 DIAGNOSIS — N898 Other specified noninflammatory disorders of vagina: Secondary | ICD-10-CM | POA: Diagnosis present

## 2024-04-07 DIAGNOSIS — J988 Other specified respiratory disorders: Secondary | ICD-10-CM | POA: Diagnosis present

## 2024-04-07 DIAGNOSIS — B9789 Other viral agents as the cause of diseases classified elsewhere: Secondary | ICD-10-CM | POA: Insufficient documentation

## 2024-04-07 DIAGNOSIS — F17200 Nicotine dependence, unspecified, uncomplicated: Secondary | ICD-10-CM | POA: Diagnosis not present

## 2024-04-07 MED ORDER — ALBUTEROL SULFATE HFA 108 (90 BASE) MCG/ACT IN AERS
1.0000 | INHALATION_SPRAY | RESPIRATORY_TRACT | 0 refills | Status: DC | PRN
Start: 2024-04-07 — End: 2024-06-02

## 2024-04-07 MED ORDER — PREDNISONE 10 MG PO TABS: 30.0000 mg | ORAL_TABLET | Freq: Every day | ORAL | 0 refills | Status: DC

## 2024-04-07 MED ORDER — FLUCONAZOLE 150 MG PO TABS: 150.0000 mg | ORAL_TABLET | ORAL | 0 refills | Status: DC

## 2024-04-07 NOTE — ED Triage Notes (Signed)
 Pt states cough,congestion and pressure in her head for the past 3 days. States she has been taking sudafed at home with no relief.   Pt also states she is having white vaginal discharge.

## 2024-04-07 NOTE — Discharge Instructions (Signed)
 We will manage this as a viral respiratory infection. For sore throat or cough try using a honey-based tea. Use 3 teaspoons of honey with juice squeezed from half lemon. Place shaved pieces of ginger into 1/2-1 cup of water and warm over stove top. Then mix the ingredients and repeat every 4 hours as needed. Please take Tylenol 500mg -650mg  once every 6 hours for fevers, aches and pains. Hydrate very well with at least 2 liters (64 ounces) of water. Eat light meals such as soups (chicken and noodles, chicken wild rice, vegetable).  Do not eat any foods that you are allergic to.  Start an antihistamine like Zyrtec (10mg  daily) for postnasal drainage, sinus congestion.  You can take this together with prednisone and albuterol for your breathing in the context of your smoking. Start fluconazole to help prevent yeast infection from taking steroids. We will update your test results tomorrow.

## 2024-04-07 NOTE — ED Provider Notes (Signed)
 [facility]  Note:  This document was prepared using Conservation officer, historic buildings and may include unintentional dictation errors.  MRN: [id number] DOB: 09-23-2001  Subjective:   Maureen Bailey is a 23 y.o. female presenting for 3 day history of acute onset sinus congestion, drainage, chest rattling, shortness of breath, productive cough. No fever, urinary frequency, n/v, abdominal pain, pelvic pain, rashes.  Smokes cigarettes daily, 1/2ppd.  Has also had vaginal discharge.  Has a history of BV and yeast infection but is not frequent.  Denies fever, n/v, abdominal pain, pelvic pain, rashes, dysuria, urinary frequency, hematuria.    No current facility-administered medications for this encounter.  Current Outpatient Medications:    acetaminophen (TYLENOL) 500 MG tablet, Take 500 mg by mouth every 6 (six) hours as needed for mild pain, fever, headache or moderate pain., Disp: , Rfl:    etonogestrel (NEXPLANON) 68 MG IMPL implant, 68 mg., Disp: , Rfl:    ketoconazole  (NIZORAL ) 2 % cream, Apply 1 Application topically daily., Disp: 15 g, Rfl: 0   lansoprazole (PREVACID) 15 MG capsule, Take 15 mg by mouth as directed., Disp: , Rfl:    lidocaine (LIDODERM) 5 %, Place 1 patch onto the skin daily., Disp: , Rfl:    norelgestromin-ethinyl estradiol (XULANE) 150-35 MCG/24HR transdermal patch, Place 1 patch onto the skin once a week., Disp: , Rfl:    ondansetron  (ZOFRAN -ODT) 4 MG disintegrating tablet, Take 4 mg by mouth every 8 (eight) hours as needed for nausea or vomiting., Disp: , Rfl:    oseltamivir  (TAMIFLU ) 75 MG capsule, Take 1 capsule (75 mg total) by mouth every 12 (twelve) hours., Disp: 10 capsule, Rfl: 0   Allergies  Allergen Reactions   Ceftriaxone Anaphylaxis, Other (See Comments) and Rash    burn  Pt states rash and could not breathe good  Has had amoxicillin  without issue  Other Reaction(s): Other (See Comments)  burn, Pt states rash and could not  breathe good, , Has had amoxicillin  without issue    Past Medical History:  Diagnosis Date   History of urinary reflux      History reviewed. No pertinent surgical history.  History reviewed. No pertinent family history.  Social History   Tobacco Use   Smoking status: Every Day    Current packs/day: 0.50    Types: Cigarettes   Smokeless tobacco: Never  Vaping Use   Vaping status: Former  Substance Use Topics   Alcohol use: Never   Drug use: Never    ROS   Objective:   Vitals: BP 104/69 (BP Location: Left Arm)   Pulse 67   Temp 99.1 F (37.3 C) (Oral)   Resp 16   LMP  (LMP Unknown)   SpO2 97%   Physical Exam Constitutional:      General: She is not in acute distress.    Appearance: Normal appearance. She is well-developed and normal weight. She is not ill-appearing, toxic-appearing or diaphoretic.  HENT:     Head: Normocephalic and atraumatic.     Right Ear: Tympanic membrane, ear canal and external ear normal. No drainage or tenderness. No middle ear effusion. There is no impacted cerumen. Tympanic membrane is not erythematous or bulging.     Left Ear: Tympanic membrane, ear canal and external ear normal. No drainage or tenderness.  No middle ear effusion. There is no impacted cerumen. Tympanic membrane is not erythematous or bulging.     Nose: Congestion present. No rhinorrhea.  Mouth/Throat:     Mouth: Mucous membranes are moist. No oral lesions.     Pharynx: No pharyngeal swelling, oropharyngeal exudate, posterior oropharyngeal erythema or uvula swelling.     Tonsils: No tonsillar exudate or tonsillar abscesses.     Comments: Postnasal drainage of pharynx.  Eyes:     General: No scleral icterus.       Right eye: No discharge.        Left eye: No discharge.     Extraocular Movements: Extraocular movements intact.     Right eye: Normal extraocular motion.     Left eye: Normal extraocular motion.     Conjunctiva/sclera: Conjunctivae normal.     Cardiovascular:     Rate and Rhythm: Normal rate and regular rhythm.     Heart sounds: Normal heart sounds. No murmur heard.    No friction rub. No gallop.  Pulmonary:     Effort: Pulmonary effort is normal. No respiratory distress.     Breath sounds: No stridor. No wheezing, rhonchi or rales.  Chest:     Chest wall: No tenderness.   Musculoskeletal:     Cervical back: Normal range of motion and neck supple.  Lymphadenopathy:     Cervical: No cervical adenopathy.   Skin:    General: Skin is warm and dry.   Neurological:     General: No focal deficit present.     Mental Status: She is alert and oriented to person, place, and time.   Psychiatric:        Mood and Affect: Mood normal.        Behavior: Behavior normal.     Assessment and Plan :   PDMP not reviewed this encounter.  1. Viral respiratory infection   2. Vaginal discharge   3. Smoker    Given her respiratory symptoms and smoking, recommend prednisone.  Otherwise, suspect viral URI, viral syndrome. Physical exam findings reassuring and vital signs stable for discharge. Advised supportive care, offered symptomatic relief.  Will treat as appropriate based off of her vaginal cytology.  Counseled patient on potential for adverse effects with medications prescribed/recommended today, ER and return-to-clinic precautions discussed, patient verbalized understanding.     Christopher Savannah, [place] 04/07/24 [id number]

## 2024-04-08 LAB — CERVICOVAGINAL ANCILLARY ONLY
Bacterial Vaginitis (gardnerella): POSITIVE — AB
Candida Glabrata: NEGATIVE
Candida Vaginitis: NEGATIVE
Chlamydia: NEGATIVE
Comment: NEGATIVE
Comment: NEGATIVE
Comment: NEGATIVE
Comment: NEGATIVE
Comment: NEGATIVE
Comment: NORMAL
Neisseria Gonorrhea: NEGATIVE
Trichomonas: NEGATIVE

## 2024-04-12 ENCOUNTER — Ambulatory Visit (HOSPITAL_COMMUNITY): Payer: Self-pay

## 2024-04-12 MED ORDER — METRONIDAZOLE 500 MG PO TABS: 500.0000 mg | ORAL_TABLET | Freq: Two times a day (BID) | ORAL | 0 refills | Status: AC

## 2024-06-02 ENCOUNTER — Ambulatory Visit

## 2024-06-02 VITALS — BP 92/58 | HR 65 | Temp 98.1°F | Ht 62.0 in | Wt 121.0 lb

## 2024-06-02 DIAGNOSIS — M25561 Pain in right knee: Secondary | ICD-10-CM

## 2024-06-02 DIAGNOSIS — Z1329 Encounter for screening for other suspected endocrine disorder: Secondary | ICD-10-CM

## 2024-06-02 DIAGNOSIS — Z1321 Encounter for screening for nutritional disorder: Secondary | ICD-10-CM

## 2024-06-02 DIAGNOSIS — J45909 Unspecified asthma, uncomplicated: Secondary | ICD-10-CM | POA: Insufficient documentation

## 2024-06-02 DIAGNOSIS — Z720 Tobacco use: Secondary | ICD-10-CM | POA: Insufficient documentation

## 2024-06-02 DIAGNOSIS — Z130 Encounter for screening for diseases of the blood and blood-forming organs and certain disorders involving the immune mechanism: Secondary | ICD-10-CM | POA: Diagnosis not present

## 2024-06-02 DIAGNOSIS — J4520 Mild intermittent asthma, uncomplicated: Secondary | ICD-10-CM | POA: Diagnosis not present

## 2024-06-02 DIAGNOSIS — Z1159 Encounter for screening for other viral diseases: Secondary | ICD-10-CM

## 2024-06-02 DIAGNOSIS — K219 Gastro-esophageal reflux disease without esophagitis: Secondary | ICD-10-CM | POA: Insufficient documentation

## 2024-06-02 DIAGNOSIS — Z7689 Persons encountering health services in other specified circumstances: Secondary | ICD-10-CM | POA: Insufficient documentation

## 2024-06-02 DIAGNOSIS — Z13228 Encounter for screening for other metabolic disorders: Secondary | ICD-10-CM | POA: Diagnosis not present

## 2024-06-02 DIAGNOSIS — G8929 Other chronic pain: Secondary | ICD-10-CM | POA: Insufficient documentation

## 2024-06-02 MED ORDER — MELOXICAM 15 MG PO TABS: 15.0000 mg | ORAL_TABLET | Freq: Every day | ORAL | 2 refills | Status: AC

## 2024-06-02 MED ORDER — ALBUTEROL SULFATE HFA 108 (90 BASE) MCG/ACT IN AERS: 1.0000 | INHALATION_SPRAY | RESPIRATORY_TRACT | 0 refills | Status: AC | PRN

## 2024-06-02 NOTE — Assessment & Plan Note (Signed)
 Current use of a fourth to a half pack per day, shared with partner. Willing to quit when her mother does. - Discuss cessation strategies if interested in quitting.

## 2024-06-02 NOTE — Assessment & Plan Note (Addendum)
 Chronic pain with daily discomfort, exacerbated by pressure and movement. Differential includes inflammation, tendon or ligament damage, or undiagnosed injury. Declined XR today, would like to try anti-inflammatory first.  - Prescribed meloxicam  (Mobic )  15 mg once daily as needed. - Consider x-ray if symptoms persist or worsen, followed by MRI if XR is clear and pain persists.

## 2024-06-02 NOTE — Progress Notes (Signed)
 New Patient Office Visit  Subjective    Patient ID: Maureen Bailey, female    DOB: Feb 14, 2001  Age: 23 y.o. MRN: [id number]  CC:  Chief Complaint  Patient presents with   New Patient (Initial Visit)    Establish Care    HPI Maureen Bailey presents to establish care. This is her first primary care appointment. She denies any significant past medical history other than asthma on albuterol  inhaler, GERD on Prevacid PRN, and acute left knee pain. She is currently sexually active with one female partner has a Nexplanon implant placed. Periods are irregular in the sense that she will skip several months before she has a period, and this has been going on since the placement of the implant. She works for a [employer]. Eating well balanced and getting plenty of physical activity.   Screenings:  Colon Cancer: N/A Lung Cancer: N/A Breast Cancer: N/A Cervical Cancer: advised patient that pap smears start at 21; she would like to schedule them here. Advised to do so before she leaves. Diabetes: Checking A1c with labs HLD: Checking lipid panel with labs The ASCVD Risk score (Arnett DK, et al., 2019) failed to calculate for the following reasons:   The 2019 ASCVD risk score is only valid for ages 40 to 102  Acute Problems: Knee pain - Significant pain in left knee, worsened by walking up stairs  - Episodes of waking with knee 'on fire' - Frequent popping sounds in the affected knee - Pain occurs daily - No medication taken for knee pain - Denies known injury or falls  Allergic and respiratory symptoms - Seasonal allergies currently flared - Uses albuterol  inhaler once daily for allergy-related symptoms with good effect  Gastrointestinal symptoms - Takes Prevacid (lansoprazole) as needed for heartburn   Outpatient Encounter Medications as of 06/02/2024  Medication Sig   etonogestrel (NEXPLANON) 68 MG IMPL implant 68 mg.   lansoprazole (PREVACID) 15 MG capsule Take 15 mg by  mouth as directed.   meloxicam  (MOBIC ) 15 MG tablet Take 1 tablet (15 mg total) by mouth daily.   [DISCONTINUED] acetaminophen (TYLENOL) 500 MG tablet Take 500 mg by mouth every 6 (six) hours as needed for mild pain, fever, headache or moderate pain.   [DISCONTINUED] albuterol  (VENTOLIN  HFA) 108 (90 Base) MCG/ACT inhaler Inhale 1-2 puffs into the lungs every 4 (four) hours as needed for wheezing or shortness of breath.   [DISCONTINUED] fluconazole  (DIFLUCAN ) 150 MG tablet Take 1 tablet (150 mg total) by mouth every 3 (three) days.   [DISCONTINUED] ketoconazole  (NIZORAL ) 2 % cream Apply 1 Application topically daily.   [DISCONTINUED] ondansetron  (ZOFRAN -ODT) 4 MG disintegrating tablet Take 4 mg by mouth every 8 (eight) hours as needed for nausea or vomiting.   [DISCONTINUED] predniSONE  (DELTASONE ) 10 MG tablet Take 3 tablets (30 mg total) by mouth daily with breakfast.   albuterol  (VENTOLIN  HFA) 108 (90 Base) MCG/ACT inhaler Inhale 1-2 puffs into the lungs every 4 (four) hours as needed for wheezing or shortness of breath.   [DISCONTINUED] lidocaine (LIDODERM) 5 % Place 1 patch onto the skin daily. (Patient not taking: Reported on 06/02/2024)   [DISCONTINUED] norelgestromin-ethinyl estradiol (XULANE) 150-35 MCG/24HR transdermal patch Place 1 patch onto the skin once a week. (Patient not taking: Reported on 06/02/2024)   [DISCONTINUED] oseltamivir  (TAMIFLU ) 75 MG capsule Take 1 capsule (75 mg total) by mouth every 12 (twelve) hours. (Patient not taking: Reported on 06/02/2024)   No facility-administered encounter medications on file as of  06/02/2024.    Past Medical History:  Diagnosis Date   History of urinary reflux     History reviewed. No pertinent surgical history.  History reviewed. No pertinent family history.  Social History   Socioeconomic History   Marital status: Single    Spouse name: Not on file   Number of children: Not on file   Years of education: Not on file   Highest  education level: GED or equivalent  Occupational History   Not on file  Tobacco Use   Smoking status: Every Day    Current packs/day: 0.50    Types: Cigarettes   Smokeless tobacco: Never  Vaping Use   Vaping status: Former  Substance and Sexual Activity   Alcohol use: Never   Drug use: Never   Sexual activity: Not Currently  Other Topics Concern   Not on file  Social History Narrative   Not on file   Social Drivers of Health   Financial Resource Strain: Medium Risk (06/02/2024)   Overall Financial Resource Strain (CARDIA)    Difficulty of Paying Living Expenses: Somewhat hard  Food Insecurity: Patient Declined (06/02/2024)   Hunger Vital Sign    Worried About Running Out of Food in the Last Year: Patient declined    Ran Out of Food in the Last Year: Patient declined  Transportation Needs: Patient Declined (06/02/2024)   PRAPARE - Administrator, Civil Service (Medical): Patient declined    Lack of Transportation (Non-Medical): Patient declined  Physical Activity: Sufficiently Active (06/02/2024)   Exercise Vital Sign    Days of Exercise per Week: 3 days    Minutes of Exercise per Session: 60 min  Stress: No Stress Concern Present (06/02/2024)   [employer] of Occupational Health - Occupational Stress Questionnaire    Feeling of Stress: Only a little  Social Connections: Unknown (06/02/2024)   Social Connection and Isolation Panel    Frequency of Communication with Friends and Family: Once a week    Frequency of Social Gatherings with Friends and Family: Twice a week    Attends Religious Services: Never    Database administrator or Organizations: No    Attends Engineer, structural: Not on file    Marital Status: Patient declined  Catering manager Violence: Not on file    ROS  Per HPI      Objective    BP (!) 92/58   Pulse 65   Temp 98.1 F (36.7 C) (Oral)   Ht 5' 2 (1.575 m)   Wt 121 lb (54.9 kg)   SpO2 98%   BMI 22.13 kg/m    Physical Exam Constitutional:      General: She is not in acute distress.    Appearance: Normal appearance.  HENT:     Right Ear: Tympanic membrane normal.     Left Ear: Tympanic membrane normal.     Mouth/Throat:     Mouth: Mucous membranes are moist.     Pharynx: Oropharynx is clear.  Eyes:     Pupils: Pupils are equal, round, and reactive to light.  Cardiovascular:     Rate and Rhythm: Normal rate and regular rhythm.     Heart sounds: Normal heart sounds. No murmur heard.    No friction rub. No gallop.  Pulmonary:     Effort: Pulmonary effort is normal. No respiratory distress.     Breath sounds: Normal breath sounds.  Abdominal:     General: Abdomen is flat. Bowel sounds  are normal.     Palpations: Abdomen is soft.  Musculoskeletal:        General: No swelling.     Cervical back: Normal range of motion.  Lymphadenopathy:     Cervical: No cervical adenopathy.  Skin:    General: Skin is warm and dry.  Neurological:     General: No focal deficit present.     Mental Status: She is alert.  Psychiatric:        Mood and Affect: Mood normal.        Behavior: Behavior normal.        Thought Content: Thought content normal.        Assessment & Plan:   Screening for endocrine, nutritional, metabolic and immunity disorder -     VITAMIN D 25 Hydroxy (Vit-D Deficiency, Fractures); Future -     CBC with Differential/Platelet; Future -     Hemoglobin A1c; Future -     Comprehensive metabolic panel with GFR; Future -     Lipid panel; Future -     TSH; Future  Screening for viral disease -     HIV Antibody (routine testing w rflx); Future -     Hepatitis C antibody; Future  Encounter to establish care Assessment & Plan: Labs today, see orders. Answered all patient questions. Went over and encouraged/ordered age-appropriate health screenings to include pap smear. No vaccines today. Encouraged patient to aim for 150+ minutes of physical activity per week or just increase  their physical activity in general in combination with a well balanced diet that prioritizes protein and fiber to support a healthy lifestyle. Will plan for next physical in 1 year, sooner PRN. Patient verbalized understanding and was in agreement with the plan.    Allergic rhinitis with mild intermittent asthma without status asthmaticus without complication Assessment & Plan: Symptoms well-controlled with albuterol  inhaler for allergy-related symptoms. - Refilled albuterol  [facility]haler [facility] in [place]. - Recommend Zyrtec or Allegra once daily for two weeks if symptoms worsen. - Consider Flonase nasal spray if fluid behind ears becomes bothersome.   Gastroesophageal reflux disease, unspecified whether esophagitis present Assessment & Plan: Managed with as-needed lansoprazole (Prevacid). - Continue lansoprazole (Prevacid) as needed.   Tobacco use Assessment & Plan: Current use of a fourth to a half pack per day, shared with partner. Willing to quit when her mother does. - Discuss cessation strategies if interested in quitting.    Chronic pain of right knee Assessment & Plan: Chronic pain with daily discomfort, exacerbated by pressure and movement. Differential includes inflammation, tendon or ligament damage, or undiagnosed injury. Declined XR today, would like to try anti-inflammatory first.  - Prescribed meloxicam  (Mobic )  15 mg once daily as needed. - Consider x-ray if symptoms persist or worsen, followed by MRI if XR is clear and pain persists.   Other orders -     Albuterol  Sulfate HFA; Inhale 1-2 puffs into the lungs every 4 (four) hours as needed for wheezing or shortness of breath.  Dispense: 18 g; Refill: 0 -     Meloxicam ; Take 1 tablet (15 mg total) by mouth daily.  Dispense: 30 tablet; Refill: 2    Return in about 1 year (around 06/02/2025) for Physical, PAP.   Saddie JULIANNA Sacks, PA-C

## 2024-06-02 NOTE — Patient Instructions (Addendum)
 VISIT SUMMARY: Today, you were seen for a rash and knee pain. We also discussed your seasonal allergies, asthma, heartburn, irregular menstruation, contraceptive management, and tobacco use.  YOUR PLAN: -CHRONIC RIGHT KNEE PAIN: Chronic knee pain can be due to inflammation, tendon or ligament damage, or an undiagnosed injury. You have been prescribed meloxicam  (Mobic ) to take once daily as needed. If your symptoms persist or worsen, we may consider an x-ray.  -ASTHMA: Asthma is a condition where your airways narrow and swell, causing breathing difficulties. Your symptoms are well-controlled with your albuterol  inhaler, which has been refilled.  -SEASONAL ALLERGIC RHINITIS: Seasonal allergic rhinitis is an allergic reaction to pollen that causes sneezing, congestion, and a runny nose. If your symptoms worsen, you can take Zyrtec or Allegra once daily for two weeks. If the fluid behind your ears becomes bothersome, consider using Flonase nasal spray.  -GASTROESOPHAGEAL REFLUX DISEASE (GERD): GERD is a condition where stomach acid frequently flows back into the tube connecting your mouth and stomach, causing heartburn. Continue taking lansoprazole (Prevacid) as needed to manage your symptoms.  -CONTRACEPTIVE MANAGEMENT (NEXPLANON IMPLANT): The Nexplanon implant is a form of birth control that is effective for up to five years. Continue using your current Nexplanon implant for contraception.  -TOBACCO USE: You currently smoke a fourth to a half pack of cigarettes per day, shared with your partner. If you are interested in quitting, we can discuss strategies to help you stop smoking.  INSTRUCTIONS: Please follow up if your knee pain persists or worsens, or if you experience any significant changes in your menstrual cycle. Continue using your medications as prescribed and consider the recommended treatments for your seasonal allergies if needed.  If you have any problems before your next visit feel free  to message me via MyChart (minor issues or questions) or call the office, otherwise you may reach out to schedule an office visit.  Thank you! Saddie Sacks, PA-C

## 2024-06-02 NOTE — Assessment & Plan Note (Signed)
 Labs today, see orders. Answered all patient questions. Went over and encouraged/ordered age-appropriate health screenings to include pap smear. No vaccines today. Encouraged patient to aim for 150+ minutes of physical activity per week or just increase their physical activity in general in combination with a well balanced diet that prioritizes protein and fiber to support a healthy lifestyle. Will plan for next physical in 1 year, sooner PRN. Patient verbalized understanding and was in agreement with the plan.

## 2024-06-02 NOTE — Assessment & Plan Note (Signed)
 Symptoms well-controlled with albuterol  inhaler for allergy-related symptoms. - [facility] [place]. - Recommend Zyrtec or Allegra once daily for two weeks if symptoms worsen. - Consider Flonase nasal spray if fluid behind ears becomes bothersome.

## 2024-06-02 NOTE — Assessment & Plan Note (Signed)
 Managed with as-needed lansoprazole (Prevacid). - Continue lansoprazole (Prevacid) as needed.

## 2024-06-04 ENCOUNTER — Other Ambulatory Visit

## 2024-06-04 DIAGNOSIS — Z130 Encounter for screening for diseases of the blood and blood-forming organs and certain disorders involving the immune mechanism: Secondary | ICD-10-CM

## 2024-06-04 DIAGNOSIS — Z1159 Encounter for screening for other viral diseases: Secondary | ICD-10-CM

## 2024-06-05 LAB — COMPREHENSIVE METABOLIC PANEL WITH GFR
ALT: 10 IU/L (ref 0–32)
AST: 16 IU/L (ref 0–40)
Albumin: 4.5 g/dL (ref 4.0–5.0)
Alkaline Phosphatase: 62 IU/L (ref 44–121)
BUN/Creatinine Ratio: 16 (ref 9–23)
BUN: 11 mg/dL (ref 6–20)
Bilirubin Total: 0.5 mg/dL (ref 0.0–1.2)
CO2: 18 mmol/L — ABNORMAL LOW (ref 20–29)
Calcium: 9.5 mg/dL (ref 8.7–10.2)
Chloride: 107 mmol/L — ABNORMAL HIGH (ref 96–106)
Creatinine, Ser: 0.68 mg/dL (ref 0.57–1.00)
Globulin, Total: 2.5 g/dL (ref 1.5–4.5)
Glucose: 77 mg/dL (ref 70–99)
Potassium: 4.1 mmol/L (ref 3.5–5.2)
Sodium: 138 mmol/L (ref 134–144)
Total Protein: 7 g/dL (ref 6.0–8.5)
eGFR: 127 mL/min/1.73 (ref >59)

## 2024-06-05 LAB — CBC WITH DIFFERENTIAL/PLATELET
Basophils Absolute: 0 x10E3/uL (ref 0.0–0.2)
Basos: 1 %
EOS (ABSOLUTE): 0.1 x10E3/uL (ref 0.0–0.4)
Eos: 1 %
Hematocrit: 44.4 % (ref 34.0–46.6)
Hemoglobin: 13.9 g/dL (ref 11.1–15.9)
Immature Grans (Abs): 0 x10E3/uL (ref 0.0–0.1)
Immature Granulocytes: 0 %
Lymphocytes Absolute: 3 x10E3/uL (ref 0.7–3.1)
Lymphs: 44 %
MCH: 28.4 pg (ref 26.6–33.0)
MCHC: 31.3 g/dL — ABNORMAL LOW (ref 31.5–35.7)
MCV: 91 fL (ref 79–97)
Monocytes Absolute: 0.6 x10E3/uL (ref 0.1–0.9)
Monocytes: 9 %
Neutrophils Absolute: 3.1 x10E3/uL (ref 1.4–7.0)
Neutrophils: 45 %
Platelets: 198 x10E3/uL (ref 150–450)
RBC: 4.89 x10E6/uL (ref 3.77–5.28)
RDW: 12.5 % (ref 11.7–15.4)
WBC: 6.8 x10E3/uL (ref 3.4–10.8)

## 2024-06-05 LAB — VITAMIN D 25 HYDROXY (VIT D DEFICIENCY, FRACTURES): Vit D, 25-Hydroxy: 21.4 ng/mL — ABNORMAL LOW (ref 30.0–100.0)

## 2024-06-05 LAB — LIPID PANEL
Chol/HDL Ratio: 2.6 ratio (ref 0.0–4.4)
Cholesterol, Total: 177 mg/dL (ref 100–199)
HDL: 68 mg/dL (ref >39)
LDL Chol Calc (NIH): 96 mg/dL (ref 0–99)
Triglycerides: 70 mg/dL (ref 0–149)
VLDL Cholesterol Cal: 13 mg/dL (ref 5–40)

## 2024-06-05 LAB — HEMOGLOBIN A1C
Est. average glucose Bld gHb Est-mCnc: 97 mg/dL
Hgb A1c MFr Bld: 5 % (ref 4.8–5.6)

## 2024-06-05 LAB — HEPATITIS C ANTIBODY: Hep C Virus Ab: NONREACTIVE

## 2024-06-05 LAB — TSH: TSH: 1.34 u[IU]/mL (ref 0.450–4.500)

## 2024-06-05 LAB — HIV ANTIBODY (ROUTINE TESTING W REFLEX): HIV Screen 4th Generation wRfx: NONREACTIVE

## 2024-06-08 ENCOUNTER — Ambulatory Visit: Payer: Self-pay

## 2024-06-25 ENCOUNTER — Ambulatory Visit

## 2024-06-25 ENCOUNTER — Other Ambulatory Visit (HOSPITAL_COMMUNITY)
Admission: RE | Admit: 2024-06-25 | Discharge: 2024-06-25 | Disposition: A | Discharge status: Home / Self Care | Source: Ambulatory Visit

## 2024-06-25 VITALS — BP 94/59 | HR 70 | Temp 97.8°F | Ht 62.0 in | Wt 118.1 lb

## 2024-06-25 DIAGNOSIS — N898 Other specified noninflammatory disorders of vagina: Secondary | ICD-10-CM | POA: Insufficient documentation

## 2024-06-25 DIAGNOSIS — K219 Gastro-esophageal reflux disease without esophagitis: Secondary | ICD-10-CM

## 2024-06-25 MED ORDER — METRONIDAZOLE 500 MG PO TABS: 500.0000 mg | ORAL_TABLET | Freq: Two times a day (BID) | ORAL | 0 refills | Status: AC

## 2024-06-25 MED ORDER — LANSOPRAZOLE 15 MG PO CPDR: 15.0000 mg | DELAYED_RELEASE_CAPSULE | ORAL | 1 refills | Status: AC

## 2024-06-25 NOTE — Patient Instructions (Addendum)
 VISIT SUMMARY: Today, you were seen for recurrent bacterial vaginosis and chest pain. We discussed your symptoms, reviewed your current treatments, and made a plan to address each issue.  YOUR PLAN: -BACTERIAL VAGINOSIS AND SUSPECTED VULVOVAGINAL CANDIDIASIS: Bacterial vaginosis is an infection caused by an imbalance of bacteria in the vagina, and vulvovaginal candidiasis is a yeast infection. You will perform a self-swab to confirm the diagnosis and rule out a yeast infection. We have prescribed metronidazole  500 mg to be taken orally twice daily for 7 days. Please avoid alcohol during this treatment. We will contact you with the swab results and adjust the treatment if necessary.  -CHEST PAIN LIKELY DUE TO GASTROESOPHAGEAL REFLUX DISEASE (GERD): GERD is a condition where stomach acid frequently flows back into the tube connecting your mouth and stomach, causing chest pain. We have prescribed Prevacid  (lansoprazole ) to be taken as needed for chest pain. You will also receive a list of potential dietary triggers for GERD. Please take Prevacid  at the onset of chest pain and monitor your response. Keep a diary of your chest pain episodes, noting the time, activity, and dietary intake. If your symptoms persist or worsen, we may need to reassess and consider a cardiac evaluation if there is no improvement with GERD management.  INSTRUCTIONS: Please perform the self-swab as instructed and avoid alcohol while taking metronidazole . Take Prevacid  at the onset of chest pain and keep a diary of your episodes. We will contact you with the swab results and adjust your treatment if necessary. If your chest pain persists or worsens, please schedule a follow-up appointment for further evaluation.  If you have any problems before your next visit feel free to message me via MyChart (minor issues or questions) or call the office, otherwise you may reach out to schedule an office visit.  Thank you! Saddie Sacks, PA-C

## 2024-06-25 NOTE — Assessment & Plan Note (Signed)
 Suspect chest pain likely secondary to GERD rather than cardiac etiology.  - Prescribe Prevacid  (lansoprazole ) 15 mg as needed for chest pain. - Provide list of potential dietary triggers for GERD. - Instruct to take Prevacid  at onset of chest pain and monitor response. - Advise to keep a diary of chest pain episodes, noting time, activity, and dietary intake. - Reassess if symptoms persist or worsen, consider cardiac evaluation if no improvement with GERD management.

## 2024-06-25 NOTE — Progress Notes (Signed)
 Established Patient Office Visit  Subjective   Patient ID: Maureen Bailey, female    DOB: 2000/11/22  Age: 23 y.o. MRN: [id number]  Chief Complaint  Patient presents with   Annual Exam    HPI History of Present Illness   Maureen Bailey is a 23 year old female who presents with recurrent bacterial vaginosis and chest pain.  Vaginal discharge and recurrent bacterial vaginosis - Recurrent episodes of bacterial vaginosis symptoms. - Current episode associated with thick, white vaginal discharge during menstruation. - Reports malodor  - Previous treatment for bacterial vaginosis, most recently two months ago, with metronidazole . - Preventive measures include avoiding tampons, using boric acid soap, and engaging in infrequent sexual activity.  Chest pain - Sharp, random chest pain occurring both during activity and at rest. - Pain located in the chest area, lasting from thirty minutes to several hours. - Episodes are sporadic, with intervals ranging from days to months between occurrences. - Pain can be severe enough to make deep breathing difficult. - Denies heart racing, shortness of breath, HA, faintness, dizziness, swelling, or vision changes.   Urinary symptoms - No dysuria, hematuria, or other typical urinary tract infection symptoms. - Occasional urinary frequency with minimal output. - Currently menstruating and prefers to delay urinary testing until after menses.       ROS Per HPI.    Objective:     BP (!) 94/59   Pulse 70   Temp 97.8 F (36.6 C) (Oral)   Ht 5' 2 (1.575 m)   Wt 118 lb 1.3 oz (53.6 kg)   SpO2 97%   BMI 21.60 kg/m    Physical Exam Constitutional:      General: She is not in acute distress.    Appearance: Normal appearance.  Cardiovascular:     Rate and Rhythm: Normal rate and regular rhythm.     Heart sounds: Normal heart sounds. No murmur heard.    No friction rub. No gallop.  Pulmonary:     Effort: Pulmonary effort is normal. No  respiratory distress.     Breath sounds: Normal breath sounds.  Abdominal:     General: Abdomen is flat. Bowel sounds are normal. There is no distension.     Palpations: Abdomen is soft.     Tenderness: There is no abdominal tenderness.  Musculoskeletal:        General: No swelling.  Skin:    General: Skin is warm and dry.  Neurological:     General: No focal deficit present.     Mental Status: She is alert.  Psychiatric:        Mood and Affect: Mood normal.        Behavior: Behavior normal.        Thought Content: Thought content normal.      No results found for any visits on 06/25/24.    The ASCVD Risk score (Arnett DK, et al., 2019) failed to calculate for the following reasons:   The 2019 ASCVD risk score is only valid for ages 21 to 1    Assessment & Plan:   Vaginal discharge Assessment & Plan: Recurrent BV with symptoms suggesting vulvovaginal candidiasis. BV due to bacterial overgrowth. - Perform self-swab to confirm diagnosis and rule out yeast infection. - Prescribe metronidazole  500 mg orally twice daily for 7 days given that it is the weekend and results may not be in until next week. - Advise no alcohol during metronidazole  treatment. - Contact with swab results and  adjust treatment if necessary.  Orders: -     Cervicovaginal ancillary only  Gastroesophageal reflux disease, unspecified whether esophagitis present Assessment & Plan: Suspect chest pain likely secondary to GERD rather than cardiac etiology.  - Prescribe Prevacid  (lansoprazole ) 15 mg as needed for chest pain. - Provide list of potential dietary triggers for GERD. - Instruct to take Prevacid  at onset of chest pain and monitor response. - Advise to keep a diary of chest pain episodes, noting time, activity, and dietary intake. - Reassess if symptoms persist or worsen, consider cardiac evaluation if no improvement with GERD management.    Other orders -     Lansoprazole ; Take 1 capsule (15  mg total) by mouth as directed.  Dispense: 90 capsule; Refill: 1 -     metroNIDAZOLE ; Take 1 tablet (500 mg total) by mouth 2 (two) times daily for 7 days.  Dispense: 14 tablet; Refill: 0    Return if symptoms worsen or fail to improve.    Saddie JULIANNA Sacks, PA-C

## 2024-06-25 NOTE — Assessment & Plan Note (Signed)
 Recurrent BV with symptoms suggesting vulvovaginal candidiasis. BV due to bacterial overgrowth. - Perform self-swab to confirm diagnosis and rule out yeast infection. - Prescribe metronidazole  500 mg orally twice daily for 7 days given that it is the weekend and results may not be in until next week. - Advise no alcohol during metronidazole  treatment. - Contact with swab results and adjust treatment if necessary.

## 2024-06-28 ENCOUNTER — Ambulatory Visit: Payer: Self-pay

## 2024-06-28 LAB — CERVICOVAGINAL ANCILLARY ONLY
Bacterial Vaginitis (gardnerella): POSITIVE — AB
Candida Glabrata: NEGATIVE
Candida Vaginitis: NEGATIVE
Chlamydia: NEGATIVE
Comment: NEGATIVE
Comment: NEGATIVE
Comment: NEGATIVE
Comment: NEGATIVE
Comment: NEGATIVE
Comment: NORMAL
Neisseria Gonorrhea: NEGATIVE
Trichomonas: NEGATIVE

## 2024-07-23 ENCOUNTER — Ambulatory Visit

## 2024-08-04 ENCOUNTER — Ambulatory Visit

## 2024-08-04 VITALS — BP 96/61 | HR 68 | Temp 97.5°F | Ht 62.0 in | Wt 116.1 lb

## 2024-08-04 DIAGNOSIS — J3489 Other specified disorders of nose and nasal sinuses: Secondary | ICD-10-CM | POA: Insufficient documentation

## 2024-08-04 MED ORDER — MUPIROCIN 2 % EX OINT: 1.0000 | TOPICAL_OINTMENT | Freq: Two times a day (BID) | CUTANEOUS | 0 refills | Status: AC

## 2024-08-04 NOTE — Progress Notes (Signed)
   Acute Office Visit  Subjective:     Patient ID: Maureen Bailey, female    DOB: 07/24/2001, 23 y.o.   MRN: [id number]  Chief Complaint  Patient presents with   Bump inside the nose    HPI  Maureen Bailey is a 23 y.o. female who presents with inner nose soreness. Patient reports that it feels that there is a cut on the medial side of her left nostril that is very sore. Worsened by touching the nose. Denies blood, discharge, or fevers.   Reports that this current sore has been there for about 1 week, but she has had them form in the other nostril in the past and they would eventually go away on their own.  She does admit to picking at the sore and she keeps longer fingernails.    ROS Per HPI     Objective:    BP 96/61   Pulse 68   Temp (!) 97.5 F (36.4 C) (Oral)   Ht 5' 2 (1.575 m)   Wt 116 lb 1.3 oz (52.7 kg)   SpO2 100%   BMI 21.23 kg/m    Physical Exam Constitutional:      General: She is not in acute distress.    Appearance: Normal appearance.  HENT:     Nose:     Comments: Small sore present on the medial side (septal side) of left nostril. Red and inflamed. No discharge or fluctuance. Cardiovascular:     Rate and Rhythm: Normal rate and regular rhythm.     Heart sounds: Normal heart sounds. No murmur heard.    No friction rub. No gallop.  Pulmonary:     Effort: Pulmonary effort is normal. No respiratory distress.     Breath sounds: Normal breath sounds.  Musculoskeletal:        General: No swelling.  Skin:    General: Skin is warm and dry.  Neurological:     General: No focal deficit present.     Mental Status: She is alert.  Psychiatric:        Mood and Affect: Mood normal.        Behavior: Behavior normal.        Thought Content: Thought content normal.     No results found for any visits on 08/04/24.      Assessment & Plan:   Nasal sore Assessment & Plan: Rx Mupirocin ointment BID x 10-14 days applied with Q-tip to the area. Advised  to avoid picking at the area to avoid further irritation or infection.  Advised to follow up if symptoms have not improved after course of antibiotic cream. If not improving, consider referral to ENT.    Other orders -     Mupirocin; Apply 1 Application topically 2 (two) times daily.  Dispense: 22 g; Refill: 0     Return if symptoms worsen or fail to improve.  Saddie JULIANNA Sacks, PA-C

## 2024-08-04 NOTE — Assessment & Plan Note (Signed)
 Rx Mupirocin ointment BID x 10-14 days applied with Q-tip to the area. Advised to avoid picking at the area to avoid further irritation or infection.  Advised to follow up if symptoms have not improved after course of antibiotic cream. If not improving, consider referral to ENT.

## 2024-08-04 NOTE — Patient Instructions (Signed)
 It was nice to see you today!  As we discussed in clinic:  -I have prescribed Mupirocin antibiotic ointment to apply with a Q-tip to the sore in your nose. It should start feeling better within 3-5 days. Please use the ointment twice per day for 10-14 days. - If the sore is not getting better, please send me a MyChart message and let me know so we can send you to an ENT specialist if needed.  - In the meantime, try to avoid picking at the sore or the nose to prevent further injury!  If you have any problems before your next visit feel free to message me via MyChart (minor issues or questions) or call the office, otherwise you may reach out to schedule an office visit.  Thank you! Saddie Sacks, PA-C

## 2024-08-09 ENCOUNTER — Ambulatory Visit: Payer: Self-pay

## 2024-08-09 ENCOUNTER — Emergency Department (HOSPITAL_BASED_OUTPATIENT_CLINIC_OR_DEPARTMENT_OTHER)

## 2024-08-09 ENCOUNTER — Other Ambulatory Visit: Payer: Self-pay

## 2024-08-09 ENCOUNTER — Emergency Department (HOSPITAL_BASED_OUTPATIENT_CLINIC_OR_DEPARTMENT_OTHER)
Admission: EM | Admit: 2024-08-09 | Discharge: 2024-08-09 | Disposition: A | Discharge status: Home / Self Care | Attending: Emergency Medicine | Admitting: Emergency Medicine

## 2024-08-09 ENCOUNTER — Encounter (HOSPITAL_BASED_OUTPATIENT_CLINIC_OR_DEPARTMENT_OTHER): Payer: Self-pay | Admitting: *Deleted

## 2024-08-09 DIAGNOSIS — F17200 Nicotine dependence, unspecified, uncomplicated: Secondary | ICD-10-CM | POA: Diagnosis not present

## 2024-08-09 DIAGNOSIS — R1031 Right lower quadrant pain: Secondary | ICD-10-CM | POA: Diagnosis present

## 2024-08-09 DIAGNOSIS — R102 Pelvic and perineal pain unspecified side: Secondary | ICD-10-CM

## 2024-08-09 LAB — URINALYSIS, MICROSCOPIC (REFLEX)

## 2024-08-09 LAB — COMPREHENSIVE METABOLIC PANEL WITH GFR
ALT: 11 U/L (ref 0–44)
AST: 19 U/L (ref 15–41)
Albumin: 4.6 g/dL (ref 3.5–5.0)
Alkaline Phosphatase: 57 U/L (ref 38–126)
Anion gap: 12 (ref 5–15)
BUN: 17 mg/dL (ref 6–20)
CO2: 19 mmol/L — ABNORMAL LOW (ref 22–32)
Calcium: 9.4 mg/dL (ref 8.9–10.3)
Chloride: 109 mmol/L (ref 98–111)
Creatinine, Ser: 0.78 mg/dL (ref 0.44–1.00)
GFR, Estimated: >60 mL/min (ref >60)
Glucose, Bld: 89 mg/dL (ref 70–99)
Potassium: 3.9 mmol/L (ref 3.5–5.1)
Sodium: 139 mmol/L (ref 135–145)
Total Bilirubin: 0.5 mg/dL (ref 0.0–1.2)
Total Protein: 7.4 g/dL (ref 6.5–8.1)

## 2024-08-09 LAB — CBC
HCT: 40.2 % (ref 36.0–46.0)
Hemoglobin: 13.3 g/dL (ref 12.0–15.0)
MCH: 28.2 pg (ref 26.0–34.0)
MCHC: 33.1 g/dL (ref 30.0–36.0)
MCV: 85.4 fL (ref 80.0–100.0)
Platelets: 164 K/uL (ref 150–400)
RBC: 4.71 MIL/uL (ref 3.87–5.11)
RDW: 12.4 % (ref 11.5–15.5)
WBC: 13.8 K/uL — ABNORMAL HIGH (ref 4.0–10.5)
nRBC: 0 % (ref 0.0–0.2)

## 2024-08-09 LAB — URINALYSIS, ROUTINE W REFLEX MICROSCOPIC
Glucose, UA: NEGATIVE mg/dL
Ketones, ur: >=80 mg/dL — AB
Leukocytes,Ua: NEGATIVE
Nitrite: NEGATIVE
Protein, ur: NEGATIVE mg/dL
Specific Gravity, Urine: 1.015 (ref 1.005–1.030)
pH: 6 (ref 5.0–8.0)

## 2024-08-09 LAB — LIPASE, BLOOD: Lipase: 17 U/L (ref 11–51)

## 2024-08-09 LAB — PREGNANCY, URINE: Preg Test, Ur: NEGATIVE

## 2024-08-09 NOTE — Discharge Instructions (Addendum)
 As we discussed, I heavily encourage you to follow-up with your gynecologist regarding the symptoms that you have been having over the last several weeks.  At the moment, there is no concerning findings on your exam, and the pelvic ultrasound obtained today did not show any abnormalities however there is possible that you did have an ovarian cyst that ruptured causing the pain that you experienced.  In any event, you will need to follow-up with your gynecologist for definitive management of your condition.  However if you feel any worsening symptoms, increasing bleeding, dizziness, increased pelvic pain, presented to [facility] emergency department as this is where the [facility] is and they would be able to help you more directly.

## 2024-08-09 NOTE — ED Triage Notes (Addendum)
 Pt is here for evaluation of RLQ abdominal pain for several days. Pt has had nausea and vomiting with this.  No pain or burning with urination.  Pt was seen at [facility] ED this am but left due to wait.  She had blood work and urine checked.  Pt states that she is on her period and it has been ongoing for 2 weeks.  She states that the pain is cramping and sharp with pain radiating into her vagina.

## 2024-08-09 NOTE — ED Provider Notes (Signed)
 [facility] Provider Note   CSN: [id number] Arrival date & time: 08/09/24  1429     Patient presents with: Abdominal Pain   Maureen Bailey is a 23 y.o. female who presents to the ED today out of concern for white lower quadrant pain over the last several days.  Intermittent nausea without vomiting, denies any dysuria, does endorse that she is currently menstruating.  She took meloxicam  this morning, and reports that at the time of exam pain has greatly reduced, in fact is resolved.  However she states that this is present 1 week prior, with vaginal bleeding at that time.  Denies any other vaginal discharge, endorses that she is sexually active with 1 female partner.  Endorses infrequent alcohol intake, with less than 1 serving weekly.  Does endorse tobacco use with approximately 1/2 pack smoke per day, also endorses regular cannabis use with nightly use.    Abdominal Pain Associated symptoms: vaginal bleeding        Prior to Admission medications   Medication Sig Start Date End Date Taking? Authorizing Provider  albuterol  (VENTOLIN  HFA) 108 (90 Base) MCG/ACT inhaler Inhale 1-2 puffs into the lungs every 4 (four) hours as needed for wheezing or shortness of breath. 06/02/24   Gayle Saddie FALCON, PA-C  etonogestrel (NEXPLANON) 68 MG IMPL implant 68 mg.    [provider]  lansoprazole  (PREVACID ) 15 MG capsule Take 1 capsule (15 mg total) by mouth as directed. 06/25/24   Gayle Saddie FALCON, PA-C  meloxicam  (MOBIC ) 15 MG tablet Take 1 tablet (15 mg total) by mouth daily. 06/02/24   Gayle Saddie FALCON, PA-C  mupirocin ointment (BACTROBAN) 2 % Apply 1 Application topically 2 (two) times daily. 08/04/24   Gayle Saddie FALCON, PA-C    Allergies: Ceftriaxone    Review of Systems  Gastrointestinal:  Positive for abdominal pain.  Genitourinary:  Positive for pelvic pain and vaginal bleeding.  All other systems reviewed and are negative.   Updated Vital  Signs BP 114/69 (BP Location: Right Arm)   Pulse 65   Temp 98 F (36.7 C)   Resp 16   LMP 07/26/2024   SpO2 98%   Physical Exam Vitals and nursing note reviewed.  Constitutional:      General: She is awake. She is not in acute distress.    Appearance: Normal appearance. She is well-developed and normal weight. She is not ill-appearing or toxic-appearing.  HENT:     Head: Normocephalic and atraumatic.     Mouth/Throat:     Mouth: Mucous membranes are moist.     Pharynx: Oropharynx is clear.  Eyes:     Extraocular Movements: Extraocular movements intact.     Conjunctiva/sclera: Conjunctivae normal.     Pupils: Pupils are equal, round, and reactive to light.  Cardiovascular:     Rate and Rhythm: Normal rate and regular rhythm.     Pulses: Normal pulses.     Heart sounds: Normal heart sounds. No murmur heard.    No friction rub. No gallop.  Pulmonary:     Effort: Pulmonary effort is normal.     Breath sounds: Normal breath sounds.  Abdominal:     General: Abdomen is flat. Bowel sounds are normal.     Palpations: Abdomen is soft. There is no hepatomegaly or splenomegaly.     Tenderness: There is abdominal tenderness in the right lower quadrant and suprapubic area. There is no right CVA tenderness, left CVA tenderness or  rebound.     Hernia: No hernia is present.  Musculoskeletal:        General: Normal range of motion.     Cervical back: Normal range of motion and neck supple.     Right lower leg: No edema.     Left lower leg: No edema.  Skin:    General: Skin is warm and dry.     Capillary Refill: Capillary refill takes less than 2 seconds.  Neurological:     General: No focal deficit present.     Mental Status: She is alert and oriented to person, place, and time. Mental status is at baseline.     GCS: GCS eye subscore is 4. GCS verbal subscore is 5. GCS motor subscore is 6.  Psychiatric:        Mood and Affect: Mood normal.        Behavior: Behavior is cooperative.      (all labs ordered are listed, but only abnormal results are displayed) Labs Reviewed  COMPREHENSIVE METABOLIC PANEL WITH GFR - Abnormal; Notable for the following components:      Result Value   CO2 19 (*)    All other components within normal limits  CBC - Abnormal; Notable for the following components:   WBC 13.8 (*)    All other components within normal limits  URINALYSIS, ROUTINE W REFLEX MICROSCOPIC - Abnormal; Notable for the following components:   Hgb urine dipstick LARGE (*)    Bilirubin Urine SMALL (*)    Ketones, ur >=80 (*)    All other components within normal limits  URINALYSIS, MICROSCOPIC (REFLEX) - Abnormal; Notable for the following components:   Bacteria, UA RARE (*)    All other components within normal limits  LIPASE, BLOOD  PREGNANCY, URINE    EKG: None  Radiology: US  PELVIC COMPLETE W TRANSVAGINAL AND TORSION R/O Result Date: 08/09/2024 CLINICAL DATA:  Right lower quadrant pain EXAM: TRANSABDOMINAL AND TRANSVAGINAL ULTRASOUND OF PELVIS DOPPLER ULTRASOUND OF OVARIES TECHNIQUE: Both transabdominal and transvaginal ultrasound examinations of the pelvis were performed. Transabdominal technique was performed for global imaging of the pelvis including uterus, ovaries, adnexal regions, and pelvic cul-de-sac. It was necessary to proceed with endovaginal exam following the transabdominal exam to visualize the uterus endometrium adnexa. Color and duplex Doppler ultrasound was utilized to evaluate blood flow to the ovaries. COMPARISON:  CT 07/10/2022 FINDINGS: Uterus Measurements: 7 x 3.6 x 4.6 cm = volume: 60 mL. No fibroids or other mass visualized. Endometrium Thickness: 2 mm.  No focal abnormality visualized. Right ovary Measurements: 5.1 x 2.4 x 2.3 cm = volume: 14.5 mL. Cyst measuring 3.5 cm, no imaging follow-up is recommended Left ovary Measurements: 4 x 1.4 x 1.9 cm = volume: 5.5 mL. Normal appearance/no adnexal mass. Pulsed Doppler evaluation of both ovaries  demonstrates normal low-resistance arterial and venous waveforms. Other findings Trace free fluid IMPRESSION: 1. Negative for ovarian torsion. 2. Trace free fluid Electronically Signed   By: Luke Bun M.D.   On: 08/09/2024 17:48     Procedures   Medications Ordered in the ED - No data to display                                  Medical Decision Making Amount and/or Complexity of Data Reviewed Labs: ordered. Radiology: ordered.   Medical Decision Making:   Maureen Bailey is a 23 y.o. female who presented to the  ED today with right-sided pelvic pain detailed above.     Complete initial physical exam performed, notably the patient  was alert and oriented in no apparent distress.  She did have point tenderness to the right lower quadrant and suprapubic area otherwise physical exam is largely benign and reassuring..    Reviewed and confirmed nursing documentation for past medical history, family history, social history.    Initial Assessment:   With the patient's presentation of right sided abdominal/pelvic pain, consider ovarian torsion, ovarian cyst, ectopic pregnancy.   Initial Plan:  Obtain pelvic ultrasound to evaluate for ovarian torsion or other gynecologic etiology.  Screening labs including CBC and Metabolic panel to evaluate for infectious or metabolic etiology of disease.  Urinalysis with reflex culture ordered to evaluate for UTI or relevant urologic/nephrologic pathology.  Include urine pregnancy to evaluate for possible ectopic. Objective evaluation as below reviewed   Initial Study Results:   Laboratory  All laboratory results reviewed without evidence of clinically relevant pathology.   Exceptions include: Leukocytosis of 13.8, otherwise urine does show hemoglobin however she is currently menstruating believe this is contaminant.  Ketones are elevated to greater than 80.   Radiology:  All images reviewed independently. Agree with radiology report at this time.    US  PELVIC COMPLETE W TRANSVAGINAL AND TORSION R/O Result Date: 08/09/2024 CLINICAL DATA:  Right lower quadrant pain EXAM: TRANSABDOMINAL AND TRANSVAGINAL ULTRASOUND OF PELVIS DOPPLER ULTRASOUND OF OVARIES TECHNIQUE: Both transabdominal and transvaginal ultrasound examinations of the pelvis were performed. Transabdominal technique was performed for global imaging of the pelvis including uterus, ovaries, adnexal regions, and pelvic cul-de-sac. It was necessary to proceed with endovaginal exam following the transabdominal exam to visualize the uterus endometrium adnexa. Color and duplex Doppler ultrasound was utilized to evaluate blood flow to the ovaries. COMPARISON:  CT 07/10/2022 FINDINGS: Uterus Measurements: 7 x 3.6 x 4.6 cm = volume: 60 mL. No fibroids or other mass visualized. Endometrium Thickness: 2 mm.  No focal abnormality visualized. Right ovary Measurements: 5.1 x 2.4 x 2.3 cm = volume: 14.5 mL. Cyst measuring 3.5 cm, no imaging follow-up is recommended Left ovary Measurements: 4 x 1.4 x 1.9 cm = volume: 5.5 mL. Normal appearance/no adnexal mass. Pulsed Doppler evaluation of both ovaries demonstrates normal low-resistance arterial and venous waveforms. Other findings Trace free fluid IMPRESSION: 1. Negative for ovarian torsion. 2. Trace free fluid Electronically Signed   By: Luke Bun M.D.   On: 08/09/2024 17:48     Reassessment and Plan:   With the pelvic ultrasound there is no findings of ovarian torsion however there are findings of some free fluid in the pelvis.  There is an ovarian cyst that is noted on the right ovary however there is no follow-up imaging is recommended.  Given these findings, along with fairly reassuring exam, and patient desire for discharge and outpatient follow-up as stated that they believe that they have been here for an extended amount of time and did not want to continue to be in the emergency room.  Plan at this time is to discharge patient with made clear to  the patient to follow-up with gynecology for further evaluation of irregular menstrual cycles, as well as her pelvic pain.  Further gave careful return precautions and suggested she follow-[facility] at [facility] as [facility] is.  She states that she understands and agrees has no further concerns at this time, fine she is stable for discharge.  Final diagnoses:  Pelvic pain in female    ED Discharge Orders     None          Myriam Dorn BROCKS, [place] 08/09/24 1816    Ruthe Cornet, DO 08/09/24 1818

## 2024-08-09 NOTE — ED Notes (Signed)
 ED Provider at bedside.

## 2024-08-09 NOTE — Telephone Encounter (Signed)
  FYI Only or Action Required?: Action required by provider: update on patient condition.  Patient was last seen in primary care on 08/04/2024 by Gayle Saddie FALCON, PA-C.  Called Nurse Triage reporting Abdominal Cramping.  Symptoms began several days ago.  Interventions attempted: Rest, hydration, or home remedies.  Symptoms are: gradually worsening.  Triage Disposition: Go to ED Now (Notify PCP)  Patient/caregiver understands and will follow disposition?: Yes Copied from CRM [id number]. Topic: Clinical - Red Word Triage >> Aug 09, 2024 12:58 PM Mia F wrote: Red Word that prompted transfer to Nurse Triage: Lower stomach pain. Feels like menstration cramps but moreso on the right side. Pain radiates into the vagina. Pain is bad enough to make her vomit. Has been going on for 3-4 days. She went to the ER this morning and was told he rblood was abnormal but she was never put into a room so she left Reason for Disposition  [1] SEVERE pain (e.g., excruciating) AND [2] present > 1 hour  Answer Assessment - Initial Assessment Questions 1. LOCATION: Where does it hurt?      Lower right abdomen 2. RADIATION: Does the pain shoot anywhere else? (e.g., chest, back)     Toward vagina, back pain when walking 3. ONSET: When did the pain begin? (e.g., minutes, hours or days ago)      Four days ago 4. SUDDEN: Gradual or sudden onset?     Gradual, thought that she was having menstrual cramps 5. PATTERN Does the pain come and go, or is it constant?     Was intermittent at the beginning, but is now constant 6. SEVERITY: How bad is the pain?  (e.g., Scale 1-10; mild, moderate, or severe)     10/10 7. RECURRENT SYMPTOM: Have you ever had this type of stomach pain before? If Yes, ask: When was the last time? and What happened that time?      Admits to these symptoms in the past two weeks ago and the pain eventually went away after rest 8. CAUSE: What do you think is causing the stomach  pain? (e.g., gallstones, recent abdominal surgery)     unknown 9. RELIEVING/AGGRAVATING FACTORS: What makes it better or worse? (e.g., antacids, bending or twisting motion, bowel movement)     Heating pad helps a little 10. OTHER SYMPTOMS: Do you have any other symptoms? (e.g., back pain, diarrhea, fever, urination pain, vomiting)       Last BM one day ago, feels urge to have BM, after urinating or defecating, it feels like she still needs to go more.  Reports 4-5 times in last 24 hours 11. PREGNANCY: Is there any chance you are pregnant? When was your last menstrual period?       LMP 08/09/2024- heavy bleeding But has had irregular bleeding x 2 weeks  Protocols used: Abdominal Pain - Female-A-AH

## 2024-08-09 NOTE — ED Notes (Signed)
 Pt called out, stating that she wanted her IV out. This RN walked into pt room x5 mins later, pt trying to remove her IV. Pt stated that she is getting very agitated and wants to leave. This RN removed pt's IV and notified EDP. EDP to bedside.

## 2024-08-09 NOTE — ED Notes (Signed)
 Pink, blue and red top sent to lab.  Pt given urine cup and advised that urine sample is needed

## 2024-08-12 ENCOUNTER — Other Ambulatory Visit: Payer: Self-pay

## 2024-08-12 ENCOUNTER — Ambulatory Visit: Payer: Self-pay

## 2024-08-12 DIAGNOSIS — R102 Pelvic and perineal pain unspecified side: Secondary | ICD-10-CM

## 2024-08-12 DIAGNOSIS — N83209 Unspecified ovarian cyst, unspecified side: Secondary | ICD-10-CM

## 2024-08-12 NOTE — Telephone Encounter (Signed)
 I have reviewed the notes from her hospital stay and think the patient would benefit from hospital follow up with OB/GYN. I am placing an urgent referral today for ED follow up

## 2024-08-12 NOTE — Telephone Encounter (Signed)
 FYI Only or Action Required?: Action required by provider: request for appointment. No hospital follow up visits available until Dec 9.   Patient was last seen in primary care on 08/04/2024 by Gayle Saddie FALCON, PA-C.  Called Nurse Triage reporting Abdominal Pain and Vomiting.  Symptoms began a week ago.  Interventions attempted: Rest, hydration, or home remedies.  Symptoms are: unchanged.  Triage Disposition: Home Care  Patient/caregiver understands and will follow disposition?: No, refuses disposition   Copied from CRM #[id number]. Topic: Clinical - Red Word Triage >> Aug 12, 2024 10:18 AM Maureen Bailey wrote: Red Word that prompted transfer to Nurse Triage: Patient was in the hospital on 10/27 for Abdomin Pain, has had 3-4 ovarian cysts over the last two years. States last night the pain got worse and she vomited up her dinner. Wants to schedule a hospital follow up. Grandmother also wants her to get check to see if she had endometriosis. Reason for Disposition  [1] MILD-MODERATE pain AND [2] comes and goes (cramps)  Answer Assessment - Initial Assessment Questions Additional info: 1) [facility] ED on 08/09/24 ultrasound: ovarian cyst-free fluid.  2) Patient would like to rule endometriosis. She is not followed by gynecology but open to a referral of pcp choosing.  3) Hospital follow up visit needed, none are available at clinic until Dec 9. Please follow up with patient today to schedule hospital follow up or other recommendation.     1. LOCATION: Where does it hurt?      Not currently in pain, was lower right side 2. RADIATION: Does the pain shoot anywhere else? (e.g., chest, back)     denies 3. ONSET: When did the pain begin? (e.g., minutes, hours or days ago)      Few days 4. SUDDEN: Gradual or sudden onset?     Sudden  5. PATTERN Does the pain come and go, or is it constant?     Intermittent  6. SEVERITY: How bad is the pain?  (e.g., Scale 1-10; mild,  moderate, or severe)     Current 0/10 7. RECURRENT SYMPTOM: Have you ever had this type of stomach pain before? If Yes, ask: When was the last time? and What happened that time?      no 8. CAUSE: What do you think is causing the stomach pain? (e.g., gallstones, recent abdominal surgery)     Cyst  9. RELIEVING/AGGRAVATING FACTORS: What makes it better or worse? (e.g., antacids, bending or twisting motion, bowel movement)     Nothing  10. OTHER SYMPTOMS: Do you have any other symptoms? (e.g., back pain, diarrhea, fever, urination pain, vomiting)       Vomited X 1 overnight  Protocols used: Abdominal Pain - Female-A-AH

## 2024-08-12 NOTE — Telephone Encounter (Signed)
 Yes please just let her know that I have placed an urgent referral to OB/GYN for her ED follow-up

## 2024-08-13 NOTE — Telephone Encounter (Signed)
 Called and spoke with patient/ related message from the provider.

## 2024-08-14 ENCOUNTER — Encounter (HOSPITAL_COMMUNITY): Payer: Self-pay

## 2024-08-14 ENCOUNTER — Other Ambulatory Visit: Payer: Self-pay

## 2024-08-14 ENCOUNTER — Emergency Department (HOSPITAL_COMMUNITY)
Admission: EM | Admit: 2024-08-14 | Discharge: 2024-08-14 | Disposition: A | Discharge status: Home / Self Care | Attending: Emergency Medicine | Admitting: Emergency Medicine

## 2024-08-14 ENCOUNTER — Emergency Department (HOSPITAL_COMMUNITY)

## 2024-08-14 DIAGNOSIS — R109 Unspecified abdominal pain: Secondary | ICD-10-CM | POA: Diagnosis present

## 2024-08-14 DIAGNOSIS — N132 Hydronephrosis with renal and ureteral calculous obstruction: Secondary | ICD-10-CM | POA: Insufficient documentation

## 2024-08-14 DIAGNOSIS — E860 Dehydration: Secondary | ICD-10-CM | POA: Diagnosis not present

## 2024-08-14 DIAGNOSIS — N200 Calculus of kidney: Secondary | ICD-10-CM

## 2024-08-14 LAB — URINALYSIS, ROUTINE W REFLEX MICROSCOPIC
Glucose, UA: NEGATIVE mg/dL
Ketones, ur: >80 mg/dL — AB
Leukocytes,Ua: NEGATIVE
Nitrite: NEGATIVE
Protein, ur: NEGATIVE mg/dL
Specific Gravity, Urine: >1.03 — ABNORMAL HIGH (ref 1.005–1.030)
pH: 6 (ref 5.0–8.0)

## 2024-08-14 LAB — COMPREHENSIVE METABOLIC PANEL WITH GFR
ALT: 12 U/L (ref 0–44)
AST: 17 U/L (ref 15–41)
Albumin: 4.3 g/dL (ref 3.5–5.0)
Alkaline Phosphatase: 50 U/L (ref 38–126)
Anion gap: 15 (ref 5–15)
BUN: 17 mg/dL (ref 6–20)
CO2: 18 mmol/L — ABNORMAL LOW (ref 22–32)
Calcium: 9.3 mg/dL (ref 8.9–10.3)
Chloride: 107 mmol/L (ref 98–111)
Creatinine, Ser: 0.9 mg/dL (ref 0.44–1.00)
GFR, Estimated: >60 mL/min (ref >60)
Glucose, Bld: 86 mg/dL (ref 70–99)
Potassium: 3.7 mmol/L (ref 3.5–5.1)
Sodium: 140 mmol/L (ref 135–145)
Total Bilirubin: 0.8 mg/dL (ref 0.0–1.2)
Total Protein: 7.5 g/dL (ref 6.5–8.1)

## 2024-08-14 LAB — URINALYSIS, MICROSCOPIC (REFLEX)

## 2024-08-14 LAB — CBC
HCT: 42.8 % (ref 36.0–46.0)
Hemoglobin: 14.1 g/dL (ref 12.0–15.0)
MCH: 28.3 pg (ref 26.0–34.0)
MCHC: 32.9 g/dL (ref 30.0–36.0)
MCV: 85.8 fL (ref 80.0–100.0)
Platelets: 155 K/uL (ref 150–400)
RBC: 4.99 MIL/uL (ref 3.87–5.11)
RDW: 12.3 % (ref 11.5–15.5)
WBC: 7.7 K/uL (ref 4.0–10.5)
nRBC: 0 % (ref 0.0–0.2)

## 2024-08-14 LAB — LIPASE, BLOOD: Lipase: 25 U/L (ref 11–51)

## 2024-08-14 LAB — HCG, SERUM, QUALITATIVE: Preg, Serum: NEGATIVE

## 2024-08-14 MED ORDER — IOHEXOL 350 MG/ML SOLN
75.0000 mL | Freq: Once | INTRAVENOUS | Status: AC | PRN
  Administered 2024-08-14: 75 mL via INTRAVENOUS

## 2024-08-14 MED ORDER — OXYCODONE-ACETAMINOPHEN 5-325 MG PO TABS: 1.0000 | ORAL_TABLET | Freq: Three times a day (TID) | ORAL | 0 refills | Status: DC | PRN

## 2024-08-14 MED ORDER — OXYCODONE-ACETAMINOPHEN 5-325 MG PO TABS
1.0000 | ORAL_TABLET | ORAL | Status: DC | PRN
  Administered 2024-08-14: 1 via ORAL
  Filled 2024-08-14: qty 1

## 2024-08-14 MED ORDER — SODIUM CHLORIDE 0.9 % IV BOLUS
1000.0000 mL | Freq: Once | INTRAVENOUS | Status: AC
  Administered 2024-08-14: 1000 mL via INTRAVENOUS

## 2024-08-14 MED ORDER — KETOROLAC TROMETHAMINE 15 MG/ML IJ SOLN
15.0000 mg | Freq: Once | INTRAMUSCULAR | Status: AC
  Administered 2024-08-14: 15 mg via INTRAVENOUS
  Filled 2024-08-14: qty 1

## 2024-08-14 MED ORDER — ONDANSETRON 4 MG PO TBDP
4.0000 mg | ORAL_TABLET | Freq: Once | ORAL | Status: AC | PRN
  Administered 2024-08-14: 4 mg via ORAL
  Filled 2024-08-14: qty 1

## 2024-08-14 MED ORDER — ONDANSETRON HCL 4 MG/2ML IJ SOLN
4.0000 mg | Freq: Once | INTRAMUSCULAR | Status: AC
  Administered 2024-08-14: 4 mg via INTRAVENOUS
  Filled 2024-08-14: qty 2

## 2024-08-14 MED ORDER — TAMSULOSIN HCL 0.4 MG PO CAPS: 0.4000 mg | ORAL_CAPSULE | Freq: Every day | ORAL | 0 refills | Status: AC

## 2024-08-14 MED ORDER — ONDANSETRON 4 MG PO TBDP: 4.0000 mg | ORAL_TABLET | Freq: Three times a day (TID) | ORAL | 0 refills | Status: DC | PRN

## 2024-08-14 NOTE — Discharge Instructions (Addendum)
 As we discussed, you were seen in the emergency department and found to have a kidney stone.  Please take ibuprofen as needed for pain.  We are sending you home with multiple medications to assist with passing the stone and for residual pain/nausea:  -Flomax-this is a medication to help pass the stone, it allows urine to exit the body more freely.  Please take this once daily with a meal.  -Percocet-this is a narcotic/controlled substance medication that has potential addicting qualities.  We recommend that you take 1-2 tablets every 6 hours as needed for severe pain.  Do not drive or operate heavy machinery when taking this medicine as it can be sedating. Do not drink alcohol or take other sedating medications when taking this medicine for safety reasons.  Keep this out of reach of small children.  Please be aware this medicine has Tylenol in it (325 mg/tab) do not exceed the maximum dose of Tylenol in a day per over the counter recommendations should you decide to supplement with Tylenol over the counter.   -Zofran-this is an antinausea medication, you may take this every 8 hours as needed for nausea and vomiting, please allow the tablet to dissolve underneath of your tongue.   We have prescribed you new medication(s) today. Discuss the medications prescribed today with your pharmacist as they can have adverse effects and interactions with your other medicines including over the counter and prescribed medications. Seek medical evaluation if you start to experience new or abnormal symptoms after taking one of these medicines, seek care immediately if you start to experience difficulty breathing, feeling of your throat closing, facial swelling, or rash as these could be indications of a more serious allergic reaction  Please follow-up with the urology group provided in your discharge instructions within 3 to 5 days.  Return to the ER for new or worsening symptoms including but not limited to worsening  pain not controlled by these medicines, inability to keep fluids down, fever, or any other concerns that you may have.

## 2024-08-14 NOTE — ED Triage Notes (Signed)
 Pt c.o RLQ pain since last Saturday. No vaginal bleeding. Pt seen at [facility] on 10/27 and was told she had an ovarian cyst. Pt also c.o n/v

## 2024-08-14 NOTE — ED Provider Notes (Cosign Needed Addendum)
 [place] EMERGENCY DEPARTMENT AT [facility] Provider Note   CSN: [id number] Arrival date & time: 08/14/24  1122     Patient presents with: Abdominal Pain   Maureen Bailey is a 23 y.o. female.   Patient with noncontributory past medical history presents today with complaints of abdominal pain.  She reports that her pain has been ongoing for approximately 1 week.  She reports pain is located in the right lower quadrant.  Denies any history of similar pain previously.  Reports that she went to the emergency department a few days ago and had an ultrasound that showed an ovarian cyst.  Since then she is continue to have pain as well as nausea and vomiting.  Denies any history of abdominal surgeries.  No fevers or chills.  Her pain began with menses, reports she is still having some spotting.  Denies any vaginal discharge.  Does report cannabis use.  The history is provided by the patient. No language interpreter was used.  Abdominal Pain Associated symptoms: nausea and vomiting        Prior to Admission medications   Medication Sig Start Date End Date Taking? Authorizing Provider  albuterol  (VENTOLIN  HFA) 108 (90 Base) MCG/ACT inhaler Inhale 1-2 puffs into the lungs every 4 (four) hours as needed for wheezing or shortness of breath. 06/02/24   Gayle Saddie FALCON, PA-C  etonogestrel (NEXPLANON) 68 MG IMPL implant 68 mg.    [provider]  lansoprazole  (PREVACID ) 15 MG capsule Take 1 capsule (15 mg total) by mouth as directed. 06/25/24   Gayle Saddie FALCON, PA-C  meloxicam  (MOBIC ) 15 MG tablet Take 1 tablet (15 mg total) by mouth daily. 06/02/24   Gayle Saddie FALCON, PA-C  mupirocin ointment (BACTROBAN) 2 % Apply 1 Application topically 2 (two) times daily. 08/04/24   Gayle Saddie FALCON, PA-C    Allergies: Ceftriaxone    Review of Systems  Gastrointestinal:  Positive for abdominal pain, nausea and vomiting.  All other systems reviewed and are negative.   Updated Vital Signs BP (!)  136/94 (BP Location: Right Arm)   Pulse (!) 101   Temp 97.9 F (36.6 C)   Resp (!) 22   Ht 5' 2 (1.575 m)   Wt 49.9 kg   LMP  (LMP Unknown)   SpO2 99%   BMI 20.12 kg/m   Physical Exam Vitals and nursing note reviewed.  Constitutional:      General: She is not in acute distress.    Appearance: Normal appearance. She is normal weight. She is not ill-appearing, toxic-appearing or diaphoretic.  HENT:     Head: Normocephalic and atraumatic.  Cardiovascular:     Rate and Rhythm: Normal rate.  Pulmonary:     Effort: Pulmonary effort is normal. No respiratory distress.  Abdominal:     General: Abdomen is flat.     Palpations: Abdomen is soft.     Tenderness: There is abdominal tenderness in the right lower quadrant. There is no guarding or rebound.  Musculoskeletal:        General: Normal range of motion.     Cervical back: Normal range of motion.  Skin:    General: Skin is warm and dry.  Neurological:     General: No focal deficit present.     Mental Status: She is alert.  Psychiatric:        Mood and Affect: Mood normal.        Behavior: Behavior normal.     (all  labs ordered are listed, but only abnormal results are displayed) Labs Reviewed  COMPREHENSIVE METABOLIC PANEL WITH GFR - Abnormal; Notable for the following components:      Result Value   CO2 18 (*)    All other components within normal limits  URINALYSIS, ROUTINE W REFLEX MICROSCOPIC - Abnormal; Notable for the following components:   Specific Gravity, Urine >1.030 (*)    Hgb urine dipstick MODERATE (*)    Bilirubin Urine SMALL (*)    Ketones, ur >80 (*)    All other components within normal limits  URINALYSIS, MICROSCOPIC (REFLEX) - Abnormal; Notable for the following components:   Bacteria, UA RARE (*)    All other components within normal limits  LIPASE, BLOOD  CBC  HCG, SERUM, QUALITATIVE    EKG: None  Radiology: CT ABDOMEN PELVIS W CONTRAST Result Date: 08/14/2024 CLINICAL DATA:  Right  lower quadrant abdominal pain EXAM: CT ABDOMEN AND PELVIS WITH CONTRAST TECHNIQUE: Multidetector CT imaging of the abdomen and pelvis was performed using the standard protocol following bolus administration of intravenous contrast. RADIATION DOSE REDUCTION: This exam was performed according to the departmental dose-optimization program which includes automated exposure control, adjustment of the mA and/or kV according to patient size and/or use of iterative reconstruction technique. CONTRAST:  75mL OMNIPAQUE IOHEXOL 350 MG/ML SOLN COMPARISON:  07/10/2022 FINDINGS: Lower chest: No acute pleural or parenchymal lung disease. Hepatobiliary: No focal liver abnormality is seen. No gallstones, gallbladder wall thickening, or biliary dilatation. Pancreas: Unremarkable. No pancreatic ductal dilatation or surrounding inflammatory changes. Spleen: Normal in size without focal abnormality. Adrenals/Urinary Tract: There is an obstructing 3 mm distal right ureteral calculus just proximal to the UVJ, reference image 67/3. There is high-grade right-sided obstructive uropathy, with moderate right hydronephrosis and hydroureter. Delayed enhancement of the right renal parenchyma consistent with high-grade obstruction. The left kidney is unremarkable. The adrenals are normal. The bladder is decompressed, limiting its evaluation. Stomach/Bowel: No bowel obstruction or ileus. Normal appendix right lower quadrant. No bowel wall thickening or inflammatory change. Vascular/Lymphatic: No significant vascular findings are present. No enlarged abdominal or pelvic lymph nodes. Reproductive: Uterus and bilateral adnexa are unremarkable. Other: No free fluid or free intraperitoneal gas. No abdominal wall hernia. Musculoskeletal: No acute or destructive bony abnormalities. Reconstructed images demonstrate no additional findings. IMPRESSION: 1. Obstructing 3 mm distal right ureteral calculus, with high-grade right-sided obstructive uropathy. 2.  Normal appendix. Electronically Signed   By: Ozell Daring M.D.   On: 08/14/2024 13:57     Procedures   Medications Ordered in the ED  oxyCODONE-acetaminophen (PERCOCET/ROXICET) 5-325 MG per tablet 1 tablet (1 tablet Oral Given 08/14/24 1143)  ondansetron  (ZOFRAN -ODT) disintegrating tablet 4 mg (4 mg Oral Given 08/14/24 1143)  iohexol (OMNIPAQUE) 350 MG/ML injection 75 mL (75 mLs Intravenous Contrast Given 08/14/24 1338)  sodium chloride 0.9 % bolus 1,000 mL (1,000 mLs Intravenous New Bag/Given 08/14/24 1441)  ketorolac (TORADOL) 15 MG/ML injection 15 mg (15 mg Intravenous Given 08/14/24 1440)  ondansetron  (ZOFRAN ) injection 4 mg (4 mg Intravenous Given 08/14/24 1440)                                    Medical Decision Making Amount and/or Complexity of Data Reviewed Labs: ordered. Radiology: ordered.  Risk Prescription drug management.   This patient is a 23 y.o. female who presents to the ED for concern of abdominal pain, this involves an extensive number of  treatment options, and is a complaint that carries with it a high risk of complications and morbidity. The emergent differential diagnosis prior to evaluation includes, but is not limited to,  AAA, renal vascular thrombosis, mesenteric ischemia, pyelonephritis, nephrolithiasis, cystitis, biliary colic, pancreatitis, PUD, appendicitis, diverticulitis, bowel obstruction, Ectopic Pregnancy, PID/TOA, Ovarian cyst, Ovarian torsion   This is not an exhaustive differential.   Past Medical History / Co-morbidities / Social History:  has a past medical history of History of urinary reflux.  Additional history: Chart reviewed. Pertinent results include: Seen on 10/27 for similar complaints, had a pelvic ultrasound that was unremarkable.  Physical Exam: Physical exam performed. The pertinent findings include: Well-appearing, right-sided abdominal TTP, no rebound or guarding  Lab Tests: I ordered, and personally interpreted labs.  The  pertinent results include:  UA with hgb and >80 ketones, noninfectious   Imaging Studies: I ordered imaging studies including CT abdomen pelvis. I independently visualized and interpreted imaging which showed   1. Obstructing 3 mm distal right ureteral calculus, with high-grade right-sided obstructive uropathy. 2. Normal appendix.   I agree with the radiologist interpretation.   Medications: I ordered medication including Toradol, Zofran , Percocet, IV fluids for pain, nausea, dehydration. Reevaluation of the patient after these medicines showed that the patient resolved. I have reviewed the patients home medicines and have made adjustments as needed.   Disposition: After consideration of the diagnostic results and the patients response to treatment, I feel that emergency department workup does not suggest an emergent condition requiring admission or immediate intervention beyond what has been performed at this time. The plan is: Discharge with pain medication, antinausea medicine and Flomax for kidney stone.  PDMP reviewed.  Patient advised not to drive or operate machinery while taking this medication due to sedating properties.  After above interventions, patient feels better and is ready to go home.  Given referral to urology for follow-up. Evaluation and diagnostic testing in the emergency department does not suggest an emergent condition requiring admission or immediate intervention beyond what has been performed at this time.  Plan for discharge with close PCP follow-up.  Patient is understanding and amenable with plan, educated on red flag symptoms that would prompt immediate return.  Patient discharged in stable condition.  Final diagnoses:  Kidney stone    ED Discharge Orders          Ordered    oxyCODONE-acetaminophen (PERCOCET/ROXICET) 5-325 MG tablet  Every 8 hours PRN        08/14/24 1501    ondansetron  (ZOFRAN -ODT) 4 MG disintegrating tablet  Every 8 hours PRN        08/14/24  1501    tamsulosin (FLOMAX) 0.4 MG CAPS capsule  Daily        08/14/24 1501          An After Visit Summary was printed and given to the patient.      Nora Lauraine LABOR, PA-C 08/14/24 1508    Kynadi Dragos, Lauraine LABOR, PA-C 08/14/24 1508    Dreama Longs, MD 08/15/24 [phone]

## 2024-08-16 ENCOUNTER — Ambulatory Visit: Payer: Self-pay

## 2024-08-16 NOTE — Telephone Encounter (Signed)
 FYI Only or Action Required?: Action required by provider: request for appointment and update on patient condition.  Patient was last seen in primary care on 08/04/2024 by Maureen Saddie FALCON, PA-C.  Called Nurse Triage reporting Flank Pain.  Symptoms began several weeks ago.  Interventions attempted: Prescription medications: flomax and Rest, hydration, or home remedies.  Symptoms are: gradually worsening.  Triage Disposition: See Physician Within 24 Hours  Patient/caregiver understands and will follow disposition?: No, refuses disposition    Copied from CRM #[id number]. Topic: Clinical - Red Word Triage >> Aug 16, 2024  2:48 PM Wess RAMAN wrote: Red Word that prompted transfer to Nurse Triage: went to the hospital on Nov 1 and has a kidney stone.  Was told if the kidney stone did not pass today, to get in contact with a urologist  Patient is in pain and has percocet Reason for Disposition  [1] MODERATE pain (e.g., interferes with normal activities) AND [2] pain comes and goes AND [3] present > 24 hours  Answer Assessment - Initial Assessment Questions Additional info: 1) ED on 08/14/24 for flank pain, dx kidney stone, she referred to urology by ED provider, she states she was advised if she didn't pass the stone by today to call for an appointment. She has tried to call urology office given by ED but has not been able to reach anyone today, she states she has left messages for return call.  2) Patient requesting pcp hospital follow up today, none are available until 08/20/24. Requesting work in visit with pcp today or tomorrow for hospital follow up.   1. MAIN CONCERN OR SYMPTOM:  What is your main concern right now? What question do you have? What's the main symptom you're worried about? (e.g., blood in urine, flank pain)     Has not passed stone  2. ONSET: When did the  pain  start?     1.5 weeks ago  3. BETTER-SAME-WORSE: Are you getting better, staying the same, or getting  worse compared to how you felt at your last visit to the doctor (most recent medical visit)?     worse 4. VISIT DATE: When were you seen? (Date)     08/14/24 5. VISIT DOCTOR: What is the name of the doctor (or NP/PA) taking care of you now?     ED 6. VISIT DIAGNOSIS:  What was the main symptom or problem that you were seen for? Were you given a diagnosis?      Flank pain 7. TREATMENT: Did you have any treatment for your kidney stone? (e.g., none, doctor exam, lithotripsy, medicines, stent) If Yes, ask: When did you have this treatment?     Pain relief and flomax 8. NEXT APPOINTMENT: Do you have a follow-up appointment with your doctor?     Referred to urology 9. PAIN: Is there any pain? If Yes, ask: How bad is it?  (Scale 0-10; or none, mild, moderate, severe)     Severe but improved with prescribed medication 10. FEVER: Do you have a fever? If Yes, ask: What is it, how was it measured, and when did it start?       denies 11. OTHER SYMPTOMS: Do you have any other symptoms? (e.g., abdomen pain, blood in urine, vomiting)        Painful urination, pain radiates to vagina 12. PREGNANCY: Is there any chance you are pregnant? When was your last menstrual period?  Answer Assessment - Initial Assessment Questions 1. LOCATION: Where does it hurt? (e.g.,  left, right)     flank 2. ONSET: When did the pain start?     Over one week  3. SEVERITY: How bad is the pain? (e.g., Scale 1-10; mild, moderate, or severe)     Severe  4. PATTERN: Does the pain come and go, or is it constant?      Constant  5. CAUSE: What do you think is causing the pain?     Kidney stone  6. OTHER SYMPTOMS:  Do you have any other symptoms? (e.g., fever, abdomen pain, vomiting, leg weakness, burning with urination, blood in urine)     Denies 7. PREGNANCY:  Is there any chance you are pregnant? When was your last menstrual period?  Protocols used: Flank Pain-A-AH, Kidney Stone  Follow-up Call-A-AH

## 2024-08-17 ENCOUNTER — Encounter (HOSPITAL_COMMUNITY): Payer: Self-pay

## 2024-08-17 ENCOUNTER — Other Ambulatory Visit: Payer: Self-pay

## 2024-08-17 ENCOUNTER — Emergency Department (HOSPITAL_COMMUNITY)
Admission: EM | Admit: 2024-08-17 | Discharge: 2024-08-17 | Discharge status: Against Medical Advice | Attending: Physician Assistant | Admitting: Physician Assistant

## 2024-08-17 DIAGNOSIS — Z5321 Procedure and treatment not carried out due to patient leaving prior to being seen by health care provider: Secondary | ICD-10-CM | POA: Insufficient documentation

## 2024-08-17 DIAGNOSIS — R1031 Right lower quadrant pain: Secondary | ICD-10-CM | POA: Insufficient documentation

## 2024-08-17 LAB — CBC
HCT: 38.1 % (ref 36.0–46.0)
Hemoglobin: 12.5 g/dL (ref 12.0–15.0)
MCH: 28 pg (ref 26.0–34.0)
MCHC: 32.8 g/dL (ref 30.0–36.0)
MCV: 85.4 fL (ref 80.0–100.0)
Platelets: 155 K/uL (ref 150–400)
RBC: 4.46 MIL/uL (ref 3.87–5.11)
RDW: 12.7 % (ref 11.5–15.5)
WBC: 13.6 K/uL — ABNORMAL HIGH (ref 4.0–10.5)
nRBC: 0 % (ref 0.0–0.2)

## 2024-08-17 LAB — URINALYSIS, ROUTINE W REFLEX MICROSCOPIC
Bacteria, UA: NONE SEEN
Bilirubin Urine: NEGATIVE
Glucose, UA: NEGATIVE mg/dL
Ketones, ur: 20 mg/dL — AB
Nitrite: NEGATIVE
Protein, ur: NEGATIVE mg/dL
Specific Gravity, Urine: 1.028 (ref 1.005–1.030)
pH: 5 (ref 5.0–8.0)

## 2024-08-17 LAB — BASIC METABOLIC PANEL WITH GFR
Anion gap: 10 (ref 5–15)
BUN: 10 mg/dL (ref 6–20)
CO2: 20 mmol/L — ABNORMAL LOW (ref 22–32)
Calcium: 8.7 mg/dL — ABNORMAL LOW (ref 8.9–10.3)
Chloride: 106 mmol/L (ref 98–111)
Creatinine, Ser: 1.01 mg/dL — ABNORMAL HIGH (ref 0.44–1.00)
GFR, Estimated: >60 mL/min (ref >60)
Glucose, Bld: 90 mg/dL (ref 70–99)
Potassium: 3.3 mmol/L — ABNORMAL LOW (ref 3.5–5.1)
Sodium: 136 mmol/L (ref 135–145)

## 2024-08-17 NOTE — ED Triage Notes (Signed)
 Pt c.o RLQ pain, has known kidney stones, last took a percocet at 130 this afternoon.

## 2024-08-17 NOTE — Telephone Encounter (Signed)
 Pt is schedule

## 2024-08-17 NOTE — ED Notes (Signed)
 Pt made staff aware that she was leaving. Pt ws alert and oriented and ambulatory

## 2024-08-17 NOTE — ED Triage Notes (Signed)
 Pt gives verbal consent for mse

## 2024-08-18 ENCOUNTER — Emergency Department (HOSPITAL_COMMUNITY)

## 2024-08-18 ENCOUNTER — Emergency Department (HOSPITAL_COMMUNITY)
Admission: EM | Admit: 2024-08-18 | Discharge: 2024-08-18 | Disposition: A | Discharge status: Home / Self Care | Attending: Emergency Medicine | Admitting: Emergency Medicine

## 2024-08-18 ENCOUNTER — Inpatient Hospital Stay

## 2024-08-18 ENCOUNTER — Other Ambulatory Visit: Payer: Self-pay

## 2024-08-18 DIAGNOSIS — N132 Hydronephrosis with renal and ureteral calculous obstruction: Secondary | ICD-10-CM | POA: Insufficient documentation

## 2024-08-18 DIAGNOSIS — D72829 Elevated white blood cell count, unspecified: Secondary | ICD-10-CM | POA: Diagnosis not present

## 2024-08-18 DIAGNOSIS — N200 Calculus of kidney: Secondary | ICD-10-CM

## 2024-08-18 DIAGNOSIS — R10A0 Flank pain, unspecified side: Secondary | ICD-10-CM | POA: Diagnosis present

## 2024-08-18 LAB — COMPREHENSIVE METABOLIC PANEL WITH GFR
ALT: 10 U/L (ref 0–44)
AST: 15 U/L (ref 15–41)
Albumin: 4.1 g/dL (ref 3.5–5.0)
Alkaline Phosphatase: 48 U/L (ref 38–126)
Anion gap: 11 (ref 5–15)
BUN: 11 mg/dL (ref 6–20)
CO2: 20 mmol/L — ABNORMAL LOW (ref 22–32)
Calcium: 9.2 mg/dL (ref 8.9–10.3)
Chloride: 107 mmol/L (ref 98–111)
Creatinine, Ser: 0.77 mg/dL (ref 0.44–1.00)
GFR, Estimated: >60 mL/min (ref >60)
Glucose, Bld: 100 mg/dL — ABNORMAL HIGH (ref 70–99)
Potassium: 3.5 mmol/L (ref 3.5–5.1)
Sodium: 138 mmol/L (ref 135–145)
Total Bilirubin: 0.9 mg/dL (ref 0.0–1.2)
Total Protein: 7 g/dL (ref 6.5–8.1)

## 2024-08-18 LAB — CBC WITH DIFFERENTIAL/PLATELET
Abs Immature Granulocytes: 0.03 K/uL (ref 0.00–0.07)
Basophils Absolute: 0 K/uL (ref 0.0–0.1)
Basophils Relative: 0 %
Eosinophils Absolute: 0 K/uL (ref 0.0–0.5)
Eosinophils Relative: 0 %
HCT: 39.7 % (ref 36.0–46.0)
Hemoglobin: 13 g/dL (ref 12.0–15.0)
Immature Granulocytes: 0 %
Lymphocytes Relative: 6 %
Lymphs Abs: 0.7 K/uL (ref 0.7–4.0)
MCH: 27.9 pg (ref 26.0–34.0)
MCHC: 32.7 g/dL (ref 30.0–36.0)
MCV: 85.2 fL (ref 80.0–100.0)
Monocytes Absolute: 0.8 K/uL (ref 0.1–1.0)
Monocytes Relative: 6 %
Neutro Abs: 11.6 K/uL — ABNORMAL HIGH (ref 1.7–7.7)
Neutrophils Relative %: 88 %
Platelets: 153 K/uL (ref 150–400)
RBC: 4.66 MIL/uL (ref 3.87–5.11)
RDW: 12.7 % (ref 11.5–15.5)
WBC: 13.2 K/uL — ABNORMAL HIGH (ref 4.0–10.5)
nRBC: 0 % (ref 0.0–0.2)

## 2024-08-18 LAB — URINALYSIS, ROUTINE W REFLEX MICROSCOPIC
Bilirubin Urine: NEGATIVE
Glucose, UA: NEGATIVE mg/dL
Hgb urine dipstick: NEGATIVE
Ketones, ur: 80 mg/dL — AB
Leukocytes,Ua: NEGATIVE
Nitrite: NEGATIVE
Protein, ur: NEGATIVE mg/dL
Specific Gravity, Urine: 1.013 (ref 1.005–1.030)
pH: 7 (ref 5.0–8.0)

## 2024-08-18 LAB — HCG, SERUM, QUALITATIVE: Preg, Serum: NEGATIVE

## 2024-08-18 LAB — GROUP A STREP BY PCR: Group A Strep by PCR: NOT DETECTED

## 2024-08-18 MED ORDER — MORPHINE SULFATE (PF) 4 MG/ML IV SOLN
4.0000 mg | Freq: Once | INTRAVENOUS | Status: AC
  Administered 2024-08-18: 4 mg via INTRAVENOUS
  Filled 2024-08-18: qty 1

## 2024-08-18 MED ORDER — SODIUM CHLORIDE 0.9 % IV BOLUS
1000.0000 mL | Freq: Once | INTRAVENOUS | Status: AC
  Administered 2024-08-18: 1000 mL via INTRAVENOUS

## 2024-08-18 MED ORDER — OXYCODONE HCL 5 MG PO TABS: 5.0000 mg | ORAL_TABLET | ORAL | 0 refills | Status: DC | PRN

## 2024-08-18 MED ORDER — ONDANSETRON HCL 4 MG/2ML IJ SOLN
4.0000 mg | Freq: Once | INTRAMUSCULAR | Status: AC
  Administered 2024-08-18: 4 mg via INTRAVENOUS
  Filled 2024-08-18: qty 2

## 2024-08-18 NOTE — ED Triage Notes (Signed)
 Patient was diagnosed with  . Obstructing 3 mm distal right ureteral calculus, with high-grade right-sided obstructive uropathy.  Complains of worsening abd pain, n/v/ and difficulty urinating.

## 2024-08-18 NOTE — ED Notes (Signed)
Pt verbalizes understanding of DC instructions. Pt belongings returned and is ambulatory out of ED.  

## 2024-08-18 NOTE — ED Notes (Signed)
 Pt verbalizes understanding of pain medication risks and benefits. Pt agrees with fall precuations

## 2024-08-18 NOTE — ED Provider Notes (Signed)
 Maureen Bailey Arrival date & time: 08/18/24  9095     Patient presents with: Abdominal Pain   Maureen Bailey is a 23 y.o. female.   23 year old female presents today for concern of persistent flank pain.  Was recently diagnosed with kidney stone.  She states she took her pain medicine at home and did not have any relief and also could not keep anything down because of the nausea and vomiting she had this morning.  She has not been able to get a hold of the urology office since Monday.  She states she has tried all day Monday and left voicemails as well.  The history is provided by the patient. No language interpreter was used.       Prior to Admission medications   Medication Sig Start Date End Date Taking? Authorizing Provider  albuterol  (VENTOLIN  HFA) 108 (90 Base) MCG/ACT inhaler Inhale 1-2 puffs into the lungs every 4 (four) hours as needed for wheezing or shortness of breath. 06/02/24   Gayle Saddie FALCON, PA-C  etonogestrel (NEXPLANON) 68 MG IMPL implant 68 mg.    [provider]  lansoprazole  (PREVACID ) 15 MG capsule Take 1 capsule (15 mg total) by mouth as directed. 06/25/24   Gayle Saddie FALCON, PA-C  meloxicam  (MOBIC ) 15 MG tablet Take 1 tablet (15 mg total) by mouth daily. 06/02/24   Gayle Saddie FALCON, PA-C  mupirocin ointment (BACTROBAN) 2 % Apply 1 Application topically 2 (two) times daily. 08/04/24   Gayle Saddie FALCON, PA-C  ondansetron  (ZOFRAN -ODT) 4 MG disintegrating tablet Take 1 tablet (4 mg total) by mouth every 8 (eight) hours as needed. 08/14/24   Smoot, Lauraine LABOR, PA-C  oxyCODONE (ROXICODONE) 5 MG immediate release tablet Take 1 tablet (5 mg total) by mouth every 4 (four) hours as needed for severe pain (pain score 7-10). 08/18/24  Yes Hildegard, Ilario Dhaliwal, PA-C  oxyCODONE-acetaminophen (PERCOCET/ROXICET) 5-325 MG tablet Take 1 tablet by mouth every 8 (eight) hours as needed for severe pain (pain score 7-10).  08/14/24   Smoot, Lauraine LABOR, PA-C  tamsulosin (FLOMAX) 0.4 MG CAPS capsule Take 1 capsule (0.4 mg total) by mouth daily. 08/14/24   Smoot, Lauraine LABOR, PA-C    Allergies: Ceftriaxone    Review of Systems  Constitutional:  Negative for chills and fever.  Gastrointestinal:  Positive for nausea and vomiting. Negative for abdominal pain.  Genitourinary:  Positive for flank pain. Negative for difficulty urinating and dysuria.  All other systems reviewed and are negative.   Updated Vital Signs BP 134/79   Pulse 68   Temp 98.7 F (37.1 C) (Oral)   Resp 18   LMP  (LMP Unknown)   SpO2 100%   Physical Exam Vitals and nursing note reviewed.  Constitutional:      General: She is not in acute distress.    Appearance: Normal appearance. She is not ill-appearing.  HENT:     Head: Normocephalic and atraumatic.     Nose: Nose normal.  Eyes:     Conjunctiva/sclera: Conjunctivae normal.  Cardiovascular:     Rate and Rhythm: Normal rate and regular rhythm.  Pulmonary:     Effort: Pulmonary effort is normal. No respiratory distress.  Abdominal:     General: There is no distension.     Palpations: Abdomen is soft.     Tenderness: There is no abdominal tenderness. There is no guarding.  Musculoskeletal:  General: No deformity. Normal range of motion.     Cervical back: Normal range of motion.  Skin:    Findings: No rash.  Neurological:     Mental Status: She is alert.     (all labs ordered are listed, but only abnormal results are displayed) Labs Reviewed  CBC WITH DIFFERENTIAL/PLATELET - Abnormal; Notable for the following components:      Result Value   WBC 13.2 (*)    Neutro Abs 11.6 (*)    All other components within normal limits  COMPREHENSIVE METABOLIC PANEL WITH GFR - Abnormal; Notable for the following components:   CO2 20 (*)    Glucose, Bld 100 (*)    All other components within normal limits  URINALYSIS, ROUTINE W REFLEX MICROSCOPIC - Abnormal; Notable for the  following components:   Ketones, ur 80 (*)    All other components within normal limits  GROUP A STREP BY PCR  HCG, SERUM, QUALITATIVE    EKG: None  Radiology: CT Renal Stone Study Result Date: 08/18/2024 CLINICAL DATA:  Abdominal/flank pain, stone suspected EXAM: CT ABDOMEN AND PELVIS WITHOUT CONTRAST TECHNIQUE: Multidetector CT imaging of the abdomen and pelvis was performed following the standard protocol without IV contrast. RADIATION DOSE REDUCTION: This exam was performed according to the departmental dose-optimization program which includes automated exposure control, adjustment of the mA and/or kV according to patient size and/or use of iterative reconstruction technique. COMPARISON:  August 14, 2024 FINDINGS: Of note, the lack of intravenous contrast limits evaluation of the solid organ parenchyma and vascularity. Lower chest: No focal airspace consolidation or pleural effusion. Hepatobiliary: No mass.Focal fatty infiltration along the falciform ligament.No radiopaque stones or wall thickening of the gallbladder. No intrahepatic or extrahepatic biliary ductal dilation. Pancreas: No mass or main ductal dilation. No peripancreatic inflammation or fluid collection. Spleen: Normal size. No mass. Adrenals/Urinary Tract: No adrenal masses. No renal mass. Mild right-sided hydroureteronephrosis with periureteric stranding. Similarly positioned, obstructive 3 mm calculus again noted at the right UVJ. Partially distended urinary bladder without visualized abnormality. Stomach/Bowel: The stomach is decompressed without focal abnormality. No small bowel wall thickening or inflammation. No small bowel obstruction.Normal appendix. Vascular/Lymphatic: No aortic aneurysm. No intraabdominal or pelvic lymphadenopathy. Reproductive: The uterus and right ovary are within normal limits for patient's age. Left ovarian cyst measuring 3.6 cm. Trace free pelvic fluid. Other: No pneumoperitoneum, ascites, or mesenteric  inflammation. Musculoskeletal: No acute fracture or destructive lesion. IMPRESSION: 1. Similarly positioned, obstructive 3 mm calculus at the right UVJ, with unchanged mild right-sided hydroureteronephrosis. 2. Left ovarian cyst measuring 3.6 cm. Trace free fluid in the pelvis, likely physiologic in a female of this age. Electronically Signed   By: Rogelia Myers M.D.   On: 08/18/2024 11:51     Procedures   Medications Ordered in the ED  sodium chloride 0.9 % bolus 1,000 mL (0 mLs Intravenous Stopped 08/18/24 1141)  ondansetron  (ZOFRAN ) injection 4 mg (4 mg Intravenous Given 08/18/24 1004)  morphine (PF) 4 MG/ML injection 4 mg (4 mg Intravenous Given 08/18/24 1004)                                    Medical Decision Making Amount and/or Complexity of Data Reviewed Labs: ordered. Radiology: ordered.  Risk Prescription drug management.   Medical Decision Making / ED Course   This patient presents to the ED for concern of flank pain, this involves an extensive  number of treatment options, and is a complaint that carries with it a high risk of complications and morbidity.  The differential diagnosis includes nephrolithiasis, ureterolithiasis, septic stone  MDM: 23 year old female presents with above-mentioned complaints. She is overall well-appearing.  Uncomfortable appearing when she arrived.  CBC with mild leukocytosis.  CMP with preserved renal function, normal electrolytes.  Pregnancy test negative.  UA with some ketones otherwise without evidence of UTI.  CT renal stone study shows right mid ureteral 3 mm obstructive stone with mild hydroureteronephrosis.  Discussed with urology.  Since patient has good pain control that she can be discharged to follow-up outpatient.  I spoke with Ole NP.  He will attempt to get this appointment scheduled for her since he is at the clinic today.  Patient feels comfortable with this plan.  She later requested to have a strep test since some  of her family members tested positive.  She is asymptomatic. This was negative.  Patient discharged in stable condition.  Additional short course of pain medicine given since she was only discharged with 10 tablets.   Lab Tests: -I ordered, reviewed, and interpreted labs.   The pertinent results include:   Labs Reviewed  CBC WITH DIFFERENTIAL/PLATELET - Abnormal; Notable for the following components:      Result Value   WBC 13.2 (*)    Neutro Abs 11.6 (*)    All other components within normal limits  COMPREHENSIVE METABOLIC PANEL WITH GFR - Abnormal; Notable for the following components:   CO2 20 (*)    Glucose, Bld 100 (*)    All other components within normal limits  URINALYSIS, ROUTINE W REFLEX MICROSCOPIC - Abnormal; Notable for the following components:   Ketones, ur 80 (*)    All other components within normal limits  GROUP A STREP BY PCR  HCG, SERUM, QUALITATIVE      EKG  EKG Interpretation Date/Time:    Ventricular Rate:    PR Interval:    QRS Duration:    QT Interval:    QTC Calculation:   R Axis:      Text Interpretation:           Imaging Studies ordered: I ordered imaging studies including CT renal stone study I independently visualized and interpreted imaging. I agree with the radiologist interpretation   Medicines ordered and prescription drug management: Meds ordered this encounter  Medications   sodium chloride 0.9 % bolus 1,000 mL   ondansetron  (ZOFRAN ) injection 4 mg   morphine (PF) 4 MG/ML injection 4 mg   oxyCODONE (ROXICODONE) 5 MG immediate release tablet    Sig: Take 1 tablet (5 mg total) by mouth every 4 (four) hours as needed for severe pain (pain score 7-10).    Dispense:  10 tablet    Refill:  0    Supervising Provider:   CLEOTILDE ROGUE [[id number]]    -I have reviewed the patients home medicines and have made adjustments as needed   Reevaluation: After the interventions noted above, I reevaluated the patient and found that they  have :improved  Co morbidities that complicate the patient evaluation  Past Medical History:  Diagnosis Date   History of urinary reflux       Dispostion: Discharged in stable condition.  Return precaution discussed.  Patient voices understanding and is in agreement with plan.  Final diagnoses:  Nephrolithiasis    ED Discharge Orders          Ordered    oxyCODONE (ROXICODONE) 5  MG immediate release tablet  Every 4 hours PRN        08/18/24 1332               Hildegard Loge, PA-C 08/18/24 1422    Tonia Chew, MD 08/24/24 0451

## 2024-08-18 NOTE — Discharge Instructions (Addendum)
 CT scan showed 3 mm kidney stone on the right at about the same location.  I spoke to urology.  They will attempt to schedule this appointment for you.  In the meantime keep taking the Flomax and the pain medicine.  I recommend that you predominantly take Tylenol 1000 mg every 6 hours, ibuprofen  600 mg every 6 hours and then take pain medicine in addition to that if you need more pain control.  Take the Zofran  every 8 hours. Return for any emergent symptoms.  I have sent in an additional course of pain medicine in case you run out of the 1 that you were previously prescribed.  Drink lots of fluids.  Your strep test was negative.  If you become symptomatic reach out to your primary care doctor and let them know that you have been around kids that have tested positive and that you have symptoms.  That should be able to treat you then.  No need for treatment since your test was negative right now.

## 2024-08-19 MED ORDER — AZITHROMYCIN 250 MG PO TABS: ORAL_TABLET | ORAL | 0 refills | Status: AC

## 2024-08-31 ENCOUNTER — Ambulatory Visit: Admitting: Family Medicine

## 2024-08-31 ENCOUNTER — Encounter: Admitting: Obstetrics and Gynecology

## 2024-08-31 ENCOUNTER — Encounter: Payer: Self-pay | Admitting: Family Medicine

## 2024-08-31 VITALS — BP 96/59 | Ht 62.0 in | Wt 113.1 lb

## 2024-08-31 DIAGNOSIS — N200 Calculus of kidney: Secondary | ICD-10-CM | POA: Diagnosis not present

## 2024-08-31 DIAGNOSIS — K219 Gastro-esophageal reflux disease without esophagitis: Secondary | ICD-10-CM

## 2024-08-31 MED ORDER — FAMOTIDINE 40 MG PO TABS: 40.0000 mg | ORAL_TABLET | Freq: Every day | ORAL | 2 refills | Status: AC

## 2024-08-31 NOTE — Assessment & Plan Note (Signed)
-   Seen for follow-up of a right-sided kidney stone. Reports spontaneous passage of the stone. No current urologic symptoms. Denies hematuria. First episode of kidney stones. No family history. - Continue to monitor. - Strain urine if symptoms recur. - Discussed risk of kidney stones with PPIs.

## 2024-08-31 NOTE — Patient Instructions (Addendum)
 It was nice to see you today,  We addressed the following topics today: - I have sent a prescription for Pepcid to your pharmacy. Please start taking this instead of the Prevacid  (lansoprazole ). - You can take the Pepcid every day to start. Once your symptoms are better, you can try taking it only when you need it. - If your stomach symptoms like feeling full or bloated do not get better after restarting the medication, please let us  know and schedule another appointment. - You can hold onto your leftover Prevacid  in case you need it in the future for any reason.   Have a great day,  Rolan Slain, MD

## 2024-08-31 NOTE — Assessment & Plan Note (Signed)
-   History of dyspepsia previously managed with lansoprazole . Medication was stopped during the acute kidney stone illness. Now presenting with symptoms of early satiety, bloating, and increased burping, likely due to discontinuation of acid-suppressing medication. Exam reveals mild epigastric tenderness. - Switch from lansoprazole  to famotidine (Pepcid) due to concerns about PPIs increasing the risk of kidney stones. - Prescription for famotidine sent to pharmacy. - Advised to take daily initially, then can try as needed. - If symptoms do not improve, schedule a follow-up appointment sooner.

## 2024-08-31 NOTE — Progress Notes (Signed)
   Established Patient Office Visit  Subjective   Patient ID: Maureen Bailey, female    DOB: October 30, 2000  Age: 23 y.o. MRN: [id number]  Chief Complaint  Patient presents with   Hospitalization Follow-up    HPI  Subjective - Follow-up for right-sided kidney stone. Visited ED on 08/18/2024. Reports he passed the stone spontaneously. He had cancelled the follow-up urology appointment for removal. - Reports pain on the left side on the day he passed the stone, lasting for about 4 hours. No other issues since. - No current symptoms like hematuria. This was his first kidney stone.  - Reports new onset of early satiety and feeling full all the time since the kidney stone episode. - Reports increased burping. - Reports intermittent, mild pain in the upper abdomen. - No nausea, vomiting, diarrhea, or constipation.  Medications Medications recently stopped include Flomax, oxycodone, and an oxycodone/Tylenol combination, all prescribed at the hospital. Previously took lansoprazole  for acid reflux, but stopped during the acute kidney stone illness.  PMH, PSH, FH, Social Hx PMHx: History of ovarian cyst (3.5 cm on the left side, seen on imaging during ED visit), acid reflux/dyspepsia. FH: No known family history of kidney stones.  ROS GI: Positive for early satiety, bloating, and increased eructation. Negative for nausea, vomiting, diarrhea, constipation. GU: Negative for hematuria.   The ASCVD Risk score (Arnett DK, et al., 2019) failed to calculate for the following reasons:   The 2019 ASCVD risk score is only valid for ages 63 to 60  Health Maintenance Due  Topic Date Due   Cervical Cancer Screening (Pap smear)  Never done   COVID-19 Vaccine (1 - 2025-26 season) Never done      Objective:     BP (!) 96/59   Ht 5' 2 (1.575 m)   Wt 113 lb 1.9 oz (51.3 kg)   LMP  (LMP Unknown)   SpO2 100%   BMI 20.69 kg/m    Physical Exam Gen: alert, oriented Pulm: no respiratory  distress Gi: soft, mildly ttp in the epigastric region.  No CVA tenderness Psych: pleasant affect   No results found for any visits on 08/31/24.      Assessment & Plan:   Gastroesophageal reflux disease, unspecified whether esophagitis present Assessment & Plan: - History of dyspepsia previously managed with lansoprazole . Medication was stopped during the acute kidney stone illness. Now presenting with symptoms of early satiety, bloating, and increased burping, likely due to discontinuation of acid-suppressing medication. Exam reveals mild epigastric tenderness. - Switch from lansoprazole  to famotidine (Pepcid) due to concerns about PPIs increasing the risk of kidney stones. - Prescription for famotidine sent to pharmacy. - Advised to take daily initially, then can try as needed. - If symptoms do not improve, schedule a follow-up appointment sooner.   Renal stone Assessment & Plan: - Seen for follow-up of a right-sided kidney stone. Reports spontaneous passage of the stone. No current urologic symptoms. Denies hematuria. First episode of kidney stones. No family history. - Continue to monitor. - Strain urine if symptoms recur. - Discussed risk of kidney stones with PPIs.   Other orders -     Famotidine; Take 1 tablet (40 mg total) by mouth daily.  Dispense: 30 tablet; Refill: 2     Return if symptoms worsen or fail to improve.    Maureen MARLA Slain, MD

## 2024-09-02 ENCOUNTER — Ambulatory Visit

## 2024-09-20 ENCOUNTER — Ambulatory Visit: Admitting: Obstetrics and Gynecology

## 2024-09-20 ENCOUNTER — Encounter: Payer: Self-pay | Admitting: Obstetrics and Gynecology

## 2024-09-20 VITALS — BP 119/76 | HR 79 | Ht 62.5 in | Wt 111.6 lb

## 2024-09-20 DIAGNOSIS — Z3046 Encounter for surveillance of implantable subdermal contraceptive: Secondary | ICD-10-CM

## 2024-09-20 NOTE — Progress Notes (Signed)
.      GYNECOLOGY OFFICE PROCEDURE NOTE  Maureen Bailey is a 23 y.o. G0P0000 here for Nexplanon removal. Last pap smear was on n/a.  Concern for recurrent ovarian cyst Nexplanon Removal Patient identified, informed consent performed, consent signed.   Appropriate time out taken.   Nexplanon site identified.  Area prepped in usual sterile fashon. One ml of 1% lidocaine was used to anesthetize the area at the distal end of the implant. A small stab incision was made right beside the implant on the distal portion.  The Nexplanon rod was grasped using hemostats and removed without difficulty.  There was minimal blood loss. There were no complications.  3 ml of 1% lidocaine was injected around the incision for post-procedure analgesia.  Steri-strips were applied over the small incision.  A pressure bandage was applied to reduce any bruising.    The patient tolerated the procedure well and was given post procedure instructions.  Patient is planning to condoms.  Has pap scheduled with PCP, notified she can schedule with us  if she desires, or if pain/ bleeding changes    Nidia Daring, FNP [facility] [employer], [facility]

## 2024-09-20 NOTE — Progress Notes (Signed)
 NGYN presents to establish care.  Wants Nexplanon removal. Nexplanon placed 5 years ago.   No PAP Hx   Pt report cyst on ovaries, has concerns about PCOS. Pelvic CT done 08-14-24. US  done 08-09-24

## 2024-10-15 ENCOUNTER — Telehealth: Payer: Self-pay

## 2024-10-15 ENCOUNTER — Ambulatory Visit: Payer: Self-pay

## 2024-10-15 NOTE — Telephone Encounter (Signed)
 Copied from CRM [id number]. Topic: Clinical - Medication Question >> Oct 15, 2024  2:06 PM Santiya F wrote: Reason for CRM: Patient is calling in because she was seen at Urgent Care for Bronchitis and was prescribed Doxycycline-Hyclate 100 MG. Patient says she has been feeling weird since she began taking it. Patient says the first day she took it with food per instructions and she experienced extreme nausea to the point she vomited. Patient says the second time she took it she felt the extreme nausea. Patient also says she is having hot and cold flashes as well. She wants to know are those usual side effects or should she discontinue the medication?

## 2024-10-15 NOTE — Telephone Encounter (Signed)
 FYI Only or Action Required?: Action required by provider: update on patient condition.  Patient was last seen in primary care on 08/31/2024 by Chandra Toribio POUR, MD.  Called Nurse Triage reporting Medication Problem.  Symptoms began yesterday.  Interventions attempted: Prescription medications: doxycycline.  Symptoms are: gradually improving.  Triage Disposition: Call PCP Within 24 Hours  Patient/caregiver understands and will follow disposition?: Yes    Copied from CRM #[id number]. Topic: Clinical - Medication Question >> Oct 15, 2024  2:06 PM Maureen Bailey wrote: Reason for CRM: Patient is calling in because she was seen at Urgent Care for Bronchitis and was prescribed Doxycycline-Hyclate 100 MG. Patient says she has been feeling weird since she began taking it. Patient says the first day she took it with food per instructions and she experienced extreme nausea to the point she vomited. Patient says the second time she took it she felt the extreme nausea. Patient also says she is having hot and cold flashes as well. She wants to know are those usual side effects or should she discontinue the medication?     Reason for Disposition  Taking prescription medication that could cause nausea (e.g., narcotics/opiates, antibiotics, OCPs, many others)  Answer Assessment - Initial Assessment Questions Patient went to UC for bronchitis yesterday and was prescribed Doxycycline. 20-6min after taking med- hot/cold flash, light headed, heart racing, extreme nausea and ends up vomiting. Has only taken 2 doses. Symptoms resolve after emesis.  Denies CP, SOB, fever. Bronchitis is slowly resolving.   Patient will reach out to [facility] UC provider to obtain new antibiotic. Patient advised to CB if unable to get alternative or schedule with [facility]   1. NAUSEA SEVERITY: How bad is the nausea? (e.g., mild, moderate, severe; dehydration, weight loss)     Thrown up both times  2. ONSET: When did the nausea  begin?     20-30 min later after first dose  3. VOMITING: Any vomiting? If Yes, ask: How many times today?     Thrown up both times  4. RECURRENT SYMPTOM: Have you had nausea before? If Yes, ask: When was the last time? What happened that time?     Denies  5. CAUSE: What do you think is causing the nausea?     Doxycycline  6. PREGNANCY: Is there any chance you are pregnant? (e.g., unprotected intercourse, missed birth control pill, broken condom)     Denies  Protocols used: Nausea-A-AH

## 2024-10-17 NOTE — Telephone Encounter (Signed)
 I would recommend she stop the medication. Doxycyline can cause nausea but it should get better with food and it sounds like hers is not getting better.  I can send in Augmentin or Azithromycin  for her instead if she would like. Let me know.

## 2024-10-19 ENCOUNTER — Ambulatory Visit

## 2024-10-19 VITALS — BP 114/81 | HR 100 | Temp 98.4°F | Ht 62.5 in | Wt 104.0 lb

## 2024-10-19 DIAGNOSIS — R112 Nausea with vomiting, unspecified: Secondary | ICD-10-CM | POA: Diagnosis not present

## 2024-10-19 DIAGNOSIS — R051 Acute cough: Secondary | ICD-10-CM | POA: Diagnosis not present

## 2024-10-19 DIAGNOSIS — U071 COVID-19: Secondary | ICD-10-CM | POA: Insufficient documentation

## 2024-10-19 LAB — POC COVID19/FLU A&B COMBO
Covid Antigen, POC: POSITIVE — AB
Influenza A Antigen, POC: NEGATIVE
Influenza B Antigen, POC: NEGATIVE

## 2024-10-19 MED ORDER — HYDROCODONE BIT-HOMATROP MBR 5-1.5 MG/5ML PO SOLN: 5.0000 mL | Freq: Four times a day (QID) | ORAL | 0 refills | Status: AC | PRN

## 2024-10-19 MED ORDER — ONDANSETRON HCL 4 MG PO TABS: 4.0000 mg | ORAL_TABLET | Freq: Three times a day (TID) | ORAL | 0 refills | Status: AC | PRN

## 2024-10-19 NOTE — Assessment & Plan Note (Signed)
 POC Covid/Flu positive for Covid-19. Discussed supportive care measures to include Musinex, fluids, rest.  Recommended dietary modifications: mashed potatoes, chicken, chicken soup, crackers, peanut butter, applesauce.  Discussed importance of continuing to push electrolytes and fluids over food until symptoms start to improve more. Hycodan cough syrup script sent for acute associated cough. Zofran  4 mg q8hr prn for nasuea. Discussed antiviral therapy but patient declined. Discussed no need for more antibiotic therapy given that her symptoms are likely due to COVID infection. Advised to monitor symptoms and report if worsening or not improve.

## 2024-10-19 NOTE — Patient Instructions (Signed)
 VISIT SUMMARY: You visited us  today due to persistent nausea and diarrhea following treatment for bronchitis. We have adjusted your medications and provided new prescriptions to help manage your symptoms.  YOUR PLAN: COVID-19  - You are positive for Covid-19 and negative for Flu A and B. I have sent in Hycodan cough syrup for you to aid in symptoms.  - You can also take Musinex to help with the congestion if you want.  - There are no strict quarantine restrictions other than not leaving the house if you have run a fveer in the last 24 hours. I would recommend wearing a mask when you go out in public.   GASTROENTERITIS WITH NAUSEA, VOMITING, AND DIARRHEA: Your nausea, vomiting, and diarrhea are likely due to the antibiotics you were taking. It could also be a viral infection. -You have been prescribed Zofran  to help with nausea. -Continue taking pantoprazole if you can tolerate it. -Follow a gentle diet including mashed potatoes, chicken, chicken soup, crackers, peanut butter, and applesauce. -Monitor your symptoms and let us  know if they persist.  If you have any problems before your next visit feel free to message me via MyChart (minor issues or questions) or call the office, otherwise you may reach out to schedule an office visit.  Thank you! Saddie Sacks, PA-C

## 2024-10-19 NOTE — Progress Notes (Signed)
 "  Acute Office Visit  Subjective:     Patient ID: Maureen Bailey, female    DOB: 03-15-2001, 24 y.o.   MRN: [id number]  Chief Complaint  Patient presents with   Neck Pain    Onset:last Wednesday(10/13/24) Meds taken: Ibuprofren and Meloxicam      HPI  Discussed the use of AI scribe software for clinical note transcription with the patient, who gave verbal consent to proceed.  History of Present Illness   Maureen Bailey is a 24 year old female who presents with persistent nausea and diarrhea following treatment for bronchitis.  Gastrointestinal symptoms - Persistent nausea and vomiting since starting doxycycline for bronchitis - Diarrhea occurs every time she eats, described as mucus-like without blood - Nausea is severe enough to prevent eating; thinking about food induces nausea - Able to keep fluids down but struggles with solid food, experiencing nausea and 'bubble guts' after eating - Continues to experience nausea after eating gentle foods such as mashed potatoes and chicken  Medication history and adverse effects - Recently treated for bronchitis at [facility] with doxycycline, prednisone , and promethazine DM.  - Nausea and diarrhea began after starting doxycycline; discontinued doxycycline on Friday but symptoms persist - Stopped taking prednisone ; continues to take meloxicam .  Respiratory and ear symptoms - Initial symptoms began as cough with throat and neck pain two to three days before urgent care visit - Congestion and ear pain persist - No recent fever recalled         ROS Per HPI     Objective:    BP 114/81   Pulse 100   Temp 98.4 F (36.9 C) (Oral)   Ht 5' 2.5 (1.588 m)   Wt 104 lb 0.6 oz (47.2 kg)   LMP 10/17/2024   SpO2 98%   BMI 18.73 kg/m    Physical Exam Constitutional:      Appearance: She is ill-appearing.  HENT:     Right Ear: Tympanic membrane normal.     Left Ear: Tympanic membrane normal.     Nose: Nose normal.      Mouth/Throat:     Pharynx: Posterior oropharyngeal erythema present.  Eyes:     Pupils: Pupils are equal, round, and reactive to light.  Cardiovascular:     Rate and Rhythm: Normal rate and regular rhythm.  Pulmonary:     Effort: Pulmonary effort is normal.     Breath sounds: Normal breath sounds.  Abdominal:     General: Bowel sounds are normal.     Palpations: Abdomen is soft.     Tenderness: There is abdominal tenderness (mild TTP (generalized)).  Musculoskeletal:        General: Normal range of motion.  Lymphadenopathy:     Cervical: Cervical adenopathy present.  Skin:    General: Skin is warm and dry.  Neurological:     General: No focal deficit present.  Psychiatric:        Mood and Affect: Mood normal.        Behavior: Behavior normal.        Thought Content: Thought content normal.      No results found for any visits on 10/19/24.      Assessment & Plan:   Nausea and vomiting, unspecified vomiting type -     Ondansetron  HCl; Take 1 tablet (4 mg total) by mouth every 8 (eight) hours as needed for nausea or vomiting.  Dispense: 20 tablet; Refill: 0 -  POC Covid19/Flu A&B Antigen  Acute cough -     HYDROcodone  Bit-Homatrop MBr; Take 5 mLs by mouth every 6 (six) hours as needed for cough.  Dispense: 120 mL; Refill: 0 -     POC Covid19/Flu A&B Antigen  COVID-19 Assessment & Plan: POC Covid/Flu positive for Covid-19. Discussed supportive care measures to include Musinex, fluids, rest.  Recommended dietary modifications: mashed potatoes, chicken, chicken soup, crackers, peanut butter, applesauce.  Discussed importance of continuing to push electrolytes and fluids over food until symptoms start to improve more. Hycodan cough syrup script sent for acute associated cough. Zofran  4 mg q8hr prn for nasuea. Discussed antiviral therapy but patient declined. Discussed no need for more antibiotic therapy given that her symptoms are likely due to COVID infection. Advised  to monitor symptoms and report if worsening or not improve.        Return if symptoms worsen or fail to improve.  Saddie JULIANNA Sacks, PA-C   "

## 2025-06-07 ENCOUNTER — Encounter
